# Patient Record
Sex: Female | Born: 1950 | Race: White | Hispanic: No | Smoking: Never smoker
Health system: Southern US, Community
[De-identification: ages and names within clinical notes are randomized; demographics above are authoritative.]

## PROBLEM LIST (undated history)

## (undated) DIAGNOSIS — K449 Diaphragmatic hernia without obstruction or gangrene: Secondary | ICD-10-CM

## (undated) DIAGNOSIS — G4730 Sleep apnea, unspecified: Secondary | ICD-10-CM

## (undated) DIAGNOSIS — I10 Essential (primary) hypertension: Secondary | ICD-10-CM

## (undated) DIAGNOSIS — N289 Disorder of kidney and ureter, unspecified: Secondary | ICD-10-CM

## (undated) HISTORY — PX: GASTRIC BYPASS: SHX52

## (undated) HISTORY — PX: APPENDECTOMY: SHX54

## (undated) HISTORY — PX: HERNIA REPAIR: SHX51

## (undated) HISTORY — PX: OTHER SURGICAL HISTORY: SHX169

## (undated) HISTORY — PX: ABDOMINAL HYSTERECTOMY: SHX81

## (undated) HISTORY — PX: BREAST SURGERY: SHX581

## (undated) HISTORY — PX: TONSILLECTOMY: SUR1361

## (undated) HISTORY — PX: CHOLECYSTECTOMY: SHX55

---

## 2005-01-15 ENCOUNTER — Emergency Department: Payer: Self-pay | Admitting: Emergency Medicine

## 2006-03-18 ENCOUNTER — Emergency Department: Payer: Self-pay | Admitting: Unknown Physician Specialty

## 2006-04-01 ENCOUNTER — Emergency Department: Payer: Self-pay | Admitting: Unknown Physician Specialty

## 2006-04-01 ENCOUNTER — Emergency Department: Payer: Self-pay | Admitting: Emergency Medicine

## 2008-02-04 ENCOUNTER — Emergency Department (HOSPITAL_COMMUNITY): Admission: EM | Admit: 2008-02-04 | Discharge: 2008-02-04 | Payer: Self-pay | Admitting: Emergency Medicine

## 2008-10-01 ENCOUNTER — Ambulatory Visit: Payer: Self-pay | Admitting: Internal Medicine

## 2009-03-15 ENCOUNTER — Ambulatory Visit: Payer: Self-pay | Admitting: Family

## 2009-03-23 ENCOUNTER — Ambulatory Visit: Payer: Self-pay | Admitting: Urology

## 2009-03-25 ENCOUNTER — Ambulatory Visit: Payer: Self-pay | Admitting: Urology

## 2009-04-08 ENCOUNTER — Ambulatory Visit: Payer: Self-pay | Admitting: Urology

## 2009-07-19 ENCOUNTER — Ambulatory Visit: Payer: Self-pay | Admitting: Internal Medicine

## 2009-10-11 ENCOUNTER — Emergency Department: Payer: Self-pay | Admitting: Unknown Physician Specialty

## 2009-11-23 ENCOUNTER — Other Ambulatory Visit: Payer: Self-pay | Admitting: Internal Medicine

## 2009-12-17 ENCOUNTER — Ambulatory Visit: Payer: Self-pay | Admitting: Internal Medicine

## 2010-01-18 ENCOUNTER — Emergency Department: Payer: Self-pay | Admitting: Emergency Medicine

## 2010-06-30 ENCOUNTER — Other Ambulatory Visit: Payer: Self-pay | Admitting: Internal Medicine

## 2010-11-18 ENCOUNTER — Other Ambulatory Visit: Payer: Self-pay | Admitting: Internal Medicine

## 2010-12-07 ENCOUNTER — Ambulatory Visit: Payer: Self-pay | Admitting: Internal Medicine

## 2011-02-12 IMAGING — CR DG ABDOMEN 1V
1 series · 2 of 2 positions shown · non-contrast
Comparison: none

REASON FOR EXAM: kidney stone   send film with pt
COMMENTS:

[Series 1: view not recorded · 0.17mm/px · 2 of 2 slices shown]
[im 1/2]
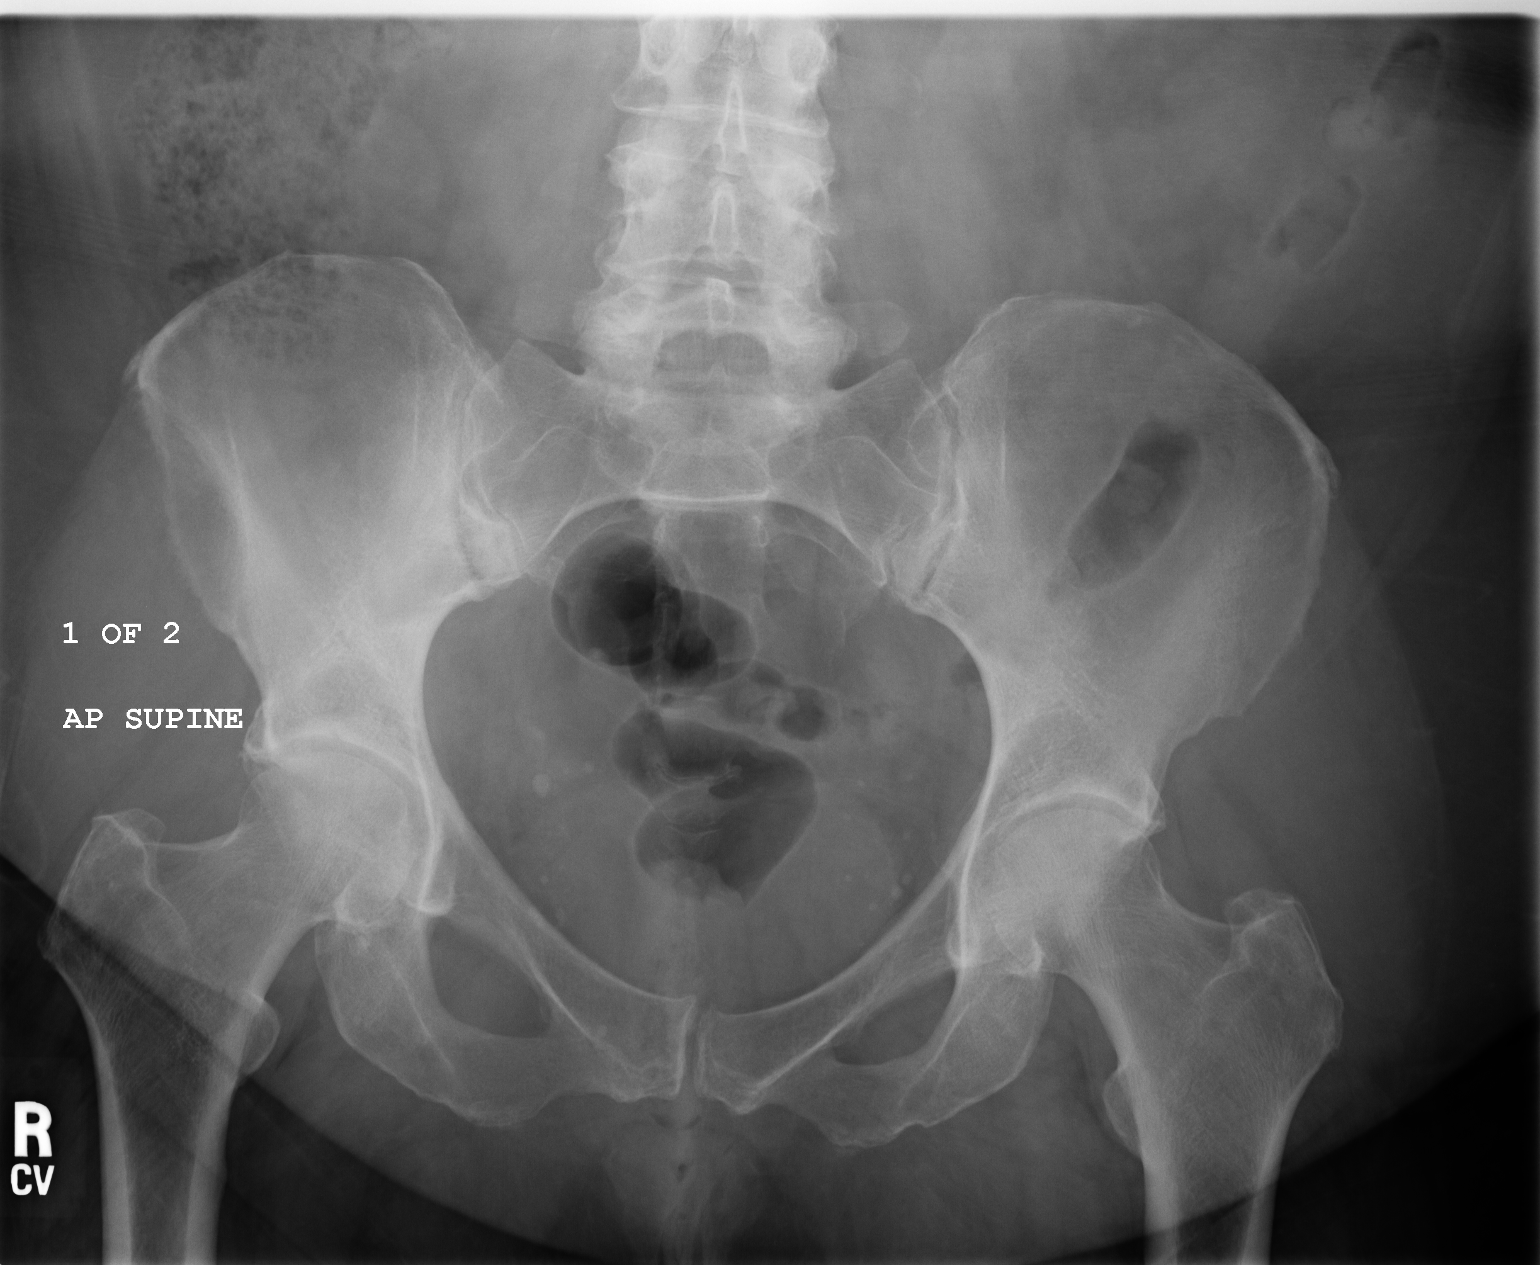
[im 2/2]
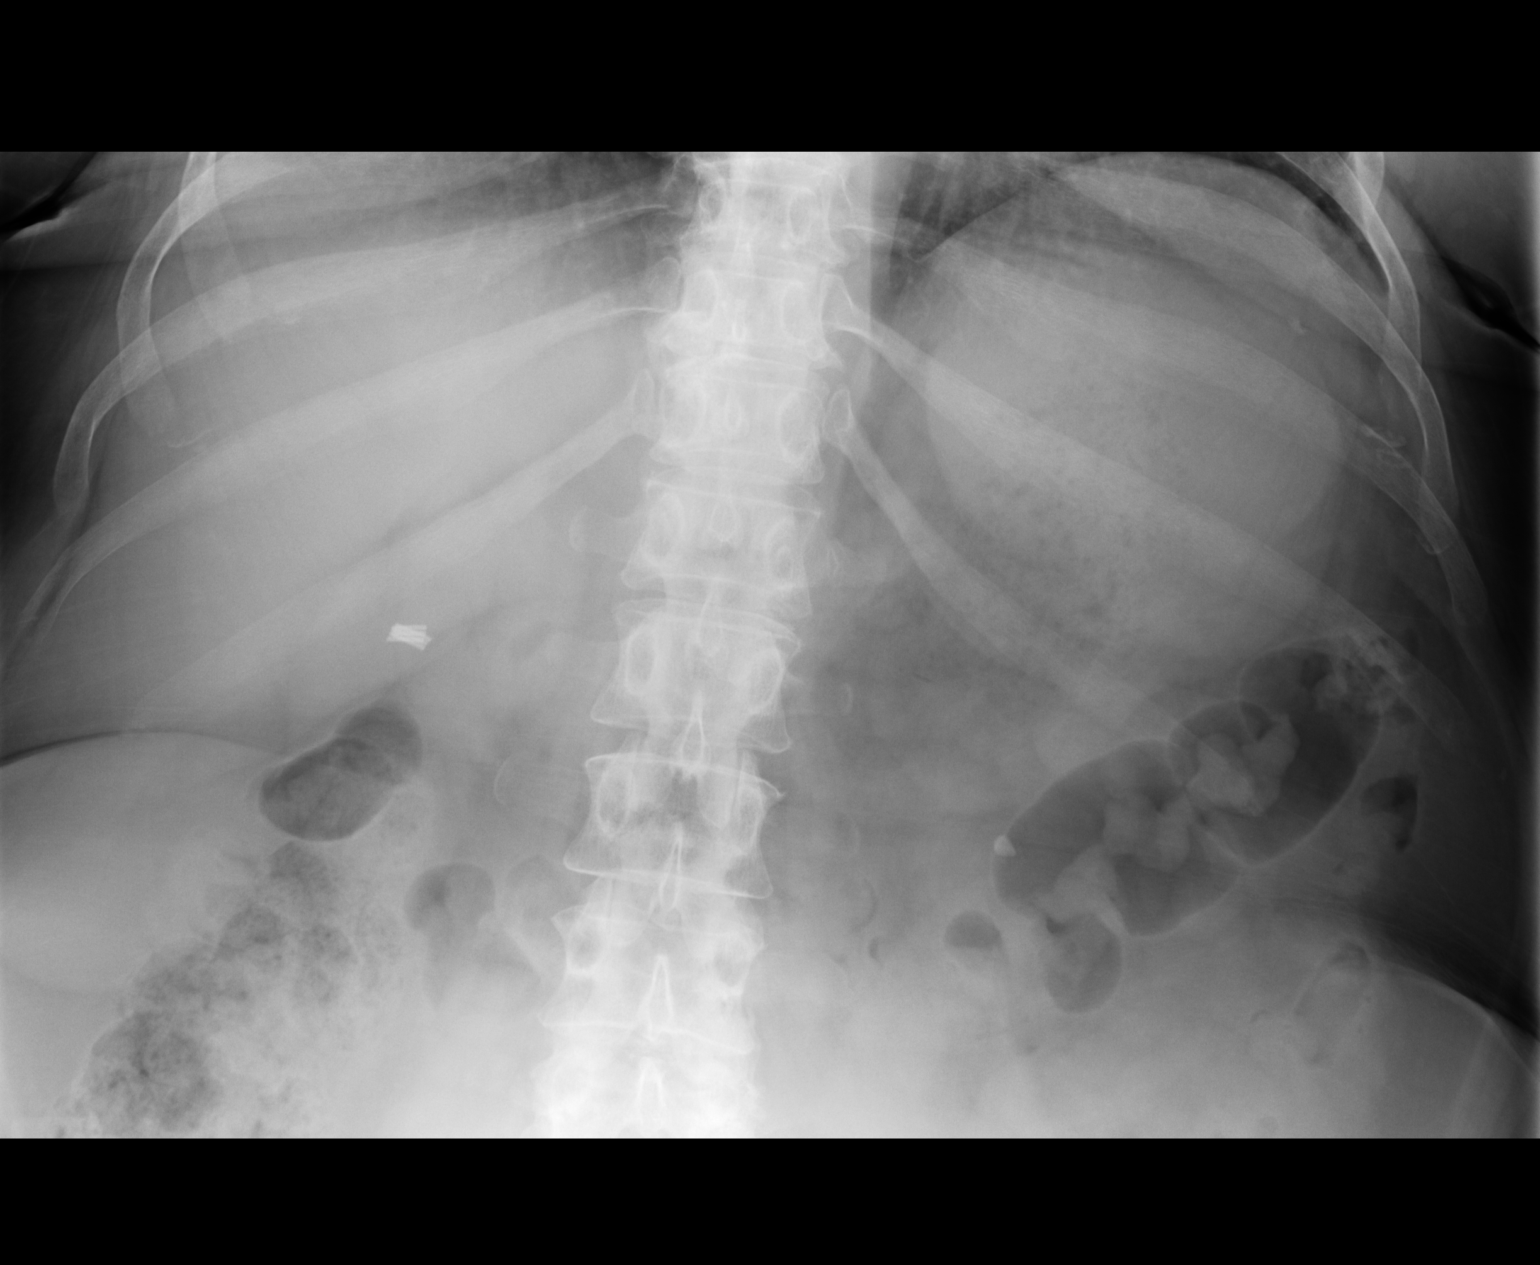

[2 of 2 positions shown; findings below may reference images not displayed]

PROCEDURE:     MDR - MDR KIDNEY URETER BLADDER  - March 23, 2009  [DATE]

RESULT:     The bowel gas pattern is within the limits of normal. There are
numerous phleboliths within the pelvis. There is a coarse calcification on
the left measuring 6 mm in diameter which likely reflects a stone. This may
be in the renal pelvis on the left but the renal outline is not well
demonstrated. I see no definite stone on the right. There are surgical clips
in the gallbladder fossa. There is mild narrowing of the left hip joint
space. Mild degenerative change of the lower lumbar spine is present.
IMPRESSION: There is a calcification that projects over the left renal
fossa which may reflect a 6 mm diameter stone.

## 2011-03-09 ENCOUNTER — Telehealth: Payer: Self-pay | Admitting: Internal Medicine

## 2011-03-09 NOTE — Telephone Encounter (Signed)
Out of rx  [place],labetalol  Wants to see if we have any samples of these.  If not call in 20 of the labeltalol she going to clinic to [facility]

## 2011-03-10 NOTE — Telephone Encounter (Signed)
Spoke with patient. She said that she was fired from her job so she has no insurance right now. She went to the [facility] and was placed on a sliding scale and will be getting her meds through them from now on unless she can get some insurance. She no longer needs refills from [place].

## 2011-03-10 NOTE — Telephone Encounter (Signed)
Left message with patient's daughter asking that patient call me back.

## 2011-03-13 ENCOUNTER — Ambulatory Visit: Payer: Self-pay | Admitting: Internal Medicine

## 2011-03-13 ENCOUNTER — Other Ambulatory Visit: Payer: Self-pay | Admitting: *Deleted

## 2011-03-13 DIAGNOSIS — Z0289 Encounter for other administrative examinations: Secondary | ICD-10-CM

## 2011-03-13 MED ORDER — LABETALOL HCL 200 MG PO TABS: 200.0000 mg | ORAL_TABLET | Freq: Two times a day (BID) | ORAL | Status: AC

## 2011-03-13 NOTE — Telephone Encounter (Signed)
Received faxed refill request from pharmacy. Is it okay to refill Labetalol?

## 2011-07-09 ENCOUNTER — Emergency Department (HOSPITAL_COMMUNITY): Payer: Self-pay

## 2011-07-09 ENCOUNTER — Inpatient Hospital Stay (HOSPITAL_COMMUNITY)
Admission: EM | Admit: 2011-07-09 | Discharge: 2011-07-10 | DRG: 392 | Disposition: A | Discharge status: Home / Self Care | Payer: Self-pay | Attending: Internal Medicine | Admitting: Internal Medicine

## 2011-07-09 DIAGNOSIS — E669 Obesity, unspecified: Secondary | ICD-10-CM | POA: Diagnosis present

## 2011-07-09 DIAGNOSIS — I10 Essential (primary) hypertension: Secondary | ICD-10-CM | POA: Diagnosis present

## 2011-07-09 DIAGNOSIS — K5732 Diverticulitis of large intestine without perforation or abscess without bleeding: Principal | ICD-10-CM | POA: Diagnosis present

## 2011-07-09 DIAGNOSIS — K5792 Diverticulitis of intestine, part unspecified, without perforation or abscess without bleeding: Secondary | ICD-10-CM

## 2011-07-09 DIAGNOSIS — E119 Type 2 diabetes mellitus without complications: Secondary | ICD-10-CM | POA: Diagnosis present

## 2011-07-09 HISTORY — DX: Essential (primary) hypertension: I10

## 2011-07-09 HISTORY — DX: Sleep apnea, unspecified: G47.30

## 2011-07-09 HISTORY — DX: Diaphragmatic hernia without obstruction or gangrene: K44.9

## 2011-07-09 LAB — DIFFERENTIAL
Basophils Relative: 0 % (ref 0–1)
Eosinophils Absolute: 0.2 10*3/uL (ref 0.0–0.7)
Lymphs Abs: 1.8 10*3/uL (ref 0.7–4.0)
Monocytes Absolute: 0.5 10*3/uL (ref 0.1–1.0)
Monocytes Relative: 4 % (ref 3–12)

## 2011-07-09 LAB — COMPREHENSIVE METABOLIC PANEL
Albumin: 3.8 g/dL (ref 3.5–5.2)
Alkaline Phosphatase: 62 U/L (ref 39–117)
BUN: 17 mg/dL (ref 6–23)
Creatinine, Ser: 1.02 mg/dL (ref 0.50–1.10)
GFR calc Af Amer: 69 mL/min — ABNORMAL LOW (ref >90)
Glucose, Bld: 250 mg/dL — ABNORMAL HIGH (ref 70–99)
Potassium: 3.9 mEq/L (ref 3.5–5.1)
Total Bilirubin: 0.3 mg/dL (ref 0.3–1.2)
Total Protein: 7.8 g/dL (ref 6.0–8.3)

## 2011-07-09 LAB — CBC
HCT: 35.3 % — ABNORMAL LOW (ref 36.0–46.0)
Hemoglobin: 11.5 g/dL — ABNORMAL LOW (ref 12.0–15.0)
MCH: 28 pg (ref 26.0–34.0)
MCHC: 32.6 g/dL (ref 30.0–36.0)
MCV: 86.1 fL (ref 78.0–100.0)
RBC: 4.1 MIL/uL (ref 3.87–5.11)

## 2011-07-09 LAB — URINALYSIS, ROUTINE W REFLEX MICROSCOPIC
Bilirubin Urine: NEGATIVE
Glucose, UA: 500 mg/dL — AB
Hgb urine dipstick: NEGATIVE
Ketones, ur: NEGATIVE mg/dL
Specific Gravity, Urine: >1.03 — ABNORMAL HIGH (ref 1.005–1.030)
pH: 5.5 (ref 5.0–8.0)

## 2011-07-09 MED ORDER — FENOFIBRATE 160 MG PO TABS
160.0000 mg | ORAL_TABLET | Freq: Every day | ORAL | Status: DC
  Administered 2011-07-09 – 2011-07-10 (×2): 160 mg via ORAL
  Filled 2011-07-09 (×4): qty 1

## 2011-07-09 MED ORDER — SODIUM CHLORIDE 0.45 % IV SOLN: INTRAVENOUS | Status: DC

## 2011-07-09 MED ORDER — AMLODIPINE BESYLATE 5 MG PO TABS
10.0000 mg | ORAL_TABLET | Freq: Every day | ORAL | Status: DC
  Administered 2011-07-09 – 2011-07-10 (×2): 10 mg via ORAL
  Filled 2011-07-09: qty 1
  Filled 2011-07-09: qty 2
  Filled 2011-07-09: qty 1

## 2011-07-09 MED ORDER — CIPROFLOXACIN IN D5W 400 MG/200ML IV SOLN
400.0000 mg | Freq: Two times a day (BID) | INTRAVENOUS | Status: DC
  Administered 2011-07-09 – 2011-07-10 (×2): 400 mg via INTRAVENOUS
  Filled 2011-07-09 (×4): qty 200

## 2011-07-09 MED ORDER — ACETAMINOPHEN 650 MG RE SUPP: 650.0000 mg | Freq: Four times a day (QID) | RECTAL | Status: DC | PRN

## 2011-07-09 MED ORDER — ACETAMINOPHEN 325 MG PO TABS: 650.0000 mg | ORAL_TABLET | Freq: Four times a day (QID) | ORAL | Status: DC | PRN

## 2011-07-09 MED ORDER — HYDROCODONE-ACETAMINOPHEN 5-325 MG PO TABS: 1.0000 | ORAL_TABLET | ORAL | Status: DC | PRN

## 2011-07-09 MED ORDER — INSULIN ASPART 100 UNIT/ML ~~LOC~~ SOLN
0.0000 [IU] | Freq: Every day | SUBCUTANEOUS | Status: DC
  Administered 2011-07-09: 2 [IU] via SUBCUTANEOUS

## 2011-07-09 MED ORDER — METRONIDAZOLE IN NACL 5-0.79 MG/ML-% IV SOLN
500.0000 mg | Freq: Three times a day (TID) | INTRAVENOUS | Status: DC
  Administered 2011-07-09 – 2011-07-10 (×2): 500 mg via INTRAVENOUS
  Filled 2011-07-09 (×6): qty 100

## 2011-07-09 MED ORDER — FENOFIBRATE 145 MG PO TABS
ORAL_TABLET | ORAL | Status: AC
Start: 2011-07-09 — End: 2011-07-09
  Filled 2011-07-09: qty 1

## 2011-07-09 MED ORDER — LIRAGLUTIDE 18 MG/3ML ~~LOC~~ SOLN
0.2000 mL | Freq: Every day | SUBCUTANEOUS | Status: DC
  Filled 2011-07-09 (×4): qty 0.2

## 2011-07-09 MED ORDER — CIPROFLOXACIN IN D5W 400 MG/200ML IV SOLN
INTRAVENOUS | Status: AC
  Filled 2011-07-09: qty 200

## 2011-07-09 MED ORDER — LOSARTAN POTASSIUM 50 MG PO TABS
25.0000 mg | ORAL_TABLET | Freq: Every day | ORAL | Status: DC
  Administered 2011-07-09: 25 mg via ORAL
  Filled 2011-07-09 (×2): qty 1

## 2011-07-09 MED ORDER — INSULIN DETEMIR 100 UNIT/ML ~~LOC~~ SOLN
80.0000 [IU] | Freq: Every day | SUBCUTANEOUS | Status: DC
  Administered 2011-07-09: 80 [IU] via SUBCUTANEOUS
  Filled 2011-07-09: qty 3

## 2011-07-09 MED ORDER — ONDANSETRON HCL 4 MG/2ML IJ SOLN: 4.0000 mg | Freq: Four times a day (QID) | INTRAMUSCULAR | Status: DC | PRN

## 2011-07-09 MED ORDER — INSULIN ASPART 100 UNIT/ML ~~LOC~~ SOLN
0.0000 [IU] | Freq: Three times a day (TID) | SUBCUTANEOUS | Status: DC
  Administered 2011-07-10: 3 [IU] via SUBCUTANEOUS
  Filled 2011-07-09: qty 3

## 2011-07-09 MED ORDER — METRONIDAZOLE IN NACL 5-0.79 MG/ML-% IV SOLN
INTRAVENOUS | Status: AC
  Filled 2011-07-09: qty 100

## 2011-07-09 MED ORDER — SODIUM CHLORIDE 0.9 % IV SOLN
INTRAVENOUS | Status: DC
  Administered 2011-07-09: 1000 mL via INTRAVENOUS

## 2011-07-09 MED ORDER — LABETALOL HCL 200 MG PO TABS
200.0000 mg | ORAL_TABLET | Freq: Two times a day (BID) | ORAL | Status: DC
  Administered 2011-07-09 – 2011-07-10 (×2): 200 mg via ORAL
  Filled 2011-07-09 (×2): qty 1

## 2011-07-09 MED ORDER — ONDANSETRON HCL 4 MG PO TABS: 4.0000 mg | ORAL_TABLET | Freq: Four times a day (QID) | ORAL | Status: DC | PRN

## 2011-07-09 MED ORDER — ENOXAPARIN SODIUM 40 MG/0.4ML ~~LOC~~ SOLN
40.0000 mg | SUBCUTANEOUS | Status: DC
  Administered 2011-07-09: 40 mg via SUBCUTANEOUS
  Filled 2011-07-09: qty 0.4

## 2011-07-09 NOTE — ED Notes (Signed)
Pt c/o pain in her lower abdomen radiating around to her back since last night. States that she always has urinary frequency and nausea due to her diabetes but nothing is abnormal. Pt sitting on stretcher on cell phone. Alert and oriented x 3. Skin warm and dry. Color pink.

## 2011-07-09 NOTE — ED Notes (Signed)
Family at bedside. 

## 2011-07-09 NOTE — ED Notes (Signed)
Pt being admitted. Awaiting bed placement.

## 2011-07-09 NOTE — ED Provider Notes (Signed)
History  This chart was scribed for Allison Lennert, MD by Bennett Scrape. This patient was seen in room APA08/APA08 and the patient's care was started at 1:23PM.  CSN: [id number]  Arrival date & time 07/09/11  1153   First MD Initiated Contact with Patient 07/09/11 1318      Chief Complaint  Patient presents with  . Abdominal Pain  . Pelvic Pain  . Nausea    Patient is a 61 y.o. female presenting with abdominal pain. The history is provided by the patient. No language interpreter was used.  Abdominal Pain The primary symptoms of the illness include abdominal pain and nausea. The primary symptoms of the illness do not include fever, fatigue, vomiting, diarrhea or dysuria. The current episode started yesterday. The onset of the illness was sudden. The problem has been gradually worsening.  The abdominal pain began yesterday. The pain came on suddenly. The abdominal pain has been gradually worsening since its onset. The abdominal pain is located in the LLQ and suprapubic region. The abdominal pain does not radiate. The severity of the abdominal pain is 7/10. The abdominal pain is relieved by certain positions. The abdominal pain is exacerbated by certain positions.  Additional symptoms associated with the illness include chills. Symptoms associated with the illness do not include anorexia, diaphoresis, constipation, urgency, hematuria, frequency or back pain. Significant associated medical issues include diabetes. Significant associated medical issues do not include inflammatory bowel disease, liver disease or diverticulitis.    Allison Lara is a 61 y.o. female who presents to the Emergency Department complaining of 24 hours of LLQ abdominal pain described as a stabbing sensation with associated nausea and chills. Symptoms are aggravated by sitting up and improved with laying down. Pt reports taking an IB profen this morning with mild improvement in symptoms. Pt states that she has had  similar episodes experienced with kidney stones but that the pain is located in a different area of the abdomen. Pt also states that she has had constant vomiting for a few months. She states that she vomits up everything she eats. She has been to see her PCP on the matter with no resolvement of the issue. She has a h/o HTN, diabetes and hiatal hernia. Pt denies smoking and drinking.  Past Medical History  Diagnosis Date  . Diabetes mellitus   . Hypertension   . Sleep apnea   . Hiatal hernia     Past Surgical History  Procedure Date  . Appendectomy   . Abdominal hysterectomy   . Cholecystectomy   . Breast surgery   . Tubal ligation   . Tonsillectomy     No family history on file.  History  Substance Use Topics  . Smoking status: Never Smoker   . Smokeless tobacco: Not on file  . Alcohol Use: No    OB History    Grav Para Term Preterm Abortions TAB SAB Ect Mult Living                  Review of Systems  Constitutional: Positive for chills. Negative for fever, diaphoresis and fatigue.  HENT: Negative for congestion, sinus pressure and ear discharge.   Eyes: Negative for discharge.  Respiratory: Negative for cough.   Cardiovascular: Negative for chest pain.  Gastrointestinal: Positive for nausea and abdominal pain. Negative for vomiting, diarrhea, constipation and anorexia.  Genitourinary: Negative for dysuria, urgency, frequency and hematuria.  Musculoskeletal: Negative for back pain.  Skin: Negative for rash.  Neurological: Negative for  seizures and headaches.  Hematological: Negative.   Psychiatric/Behavioral: Negative for hallucinations.    Allergies  Codeine; Morphine and related; and Toradol  Home Medications   Current Outpatient Rx  Name Route Sig Dispense Refill  . AMLODIPINE BESYLATE 10 MG PO TABS Oral Take 10 mg by mouth daily.      . FENOFIBRATE 145 MG PO TABS Oral Take 145 mg by mouth daily.      . IBUPROFEN 200 MG PO TABS Oral Take 200 mg by mouth  every 6 (six) hours as needed. For pain     . INSULIN DETEMIR 100 UNIT/ML [place] SOLN Subcutaneous Inject 120 Units into the skin at bedtime.      Marland Kitchen LABETALOL HCL 200 MG PO TABS Oral Take 1 tablet (200 mg total) by mouth 2 (two) times daily. 60 tablet 6  . LIRAGLUTIDE 18 MG/3ML [place] SOLN Subcutaneous Inject 0.2 mLs into the skin daily.      Marland Kitchen LOSARTAN POTASSIUM 25 MG PO TABS Oral Take 25 mg by mouth daily.        Triage Vitals: BP 142/78  Pulse 81  Temp(Src) 98.6 F (37 C) (Oral)  Resp 18  Ht 5\' 4"  (1.626 m)  Wt 222 lb (100.699 kg)  BMI 38.11 kg/m2  SpO2 98%  Physical Exam  Nursing note and vitals reviewed. Constitutional: She is oriented to person, place, and time. She appears well-developed and well-nourished.  HENT:  Head: Normocephalic and atraumatic.  Eyes: Conjunctivae and EOM are normal. No scleral icterus.  Neck: Neck supple. No thyromegaly present.  Cardiovascular: Normal rate and regular rhythm.  Exam reveals no gallop and no friction rub.   No murmur heard. Pulmonary/Chest: Effort normal and breath sounds normal. No stridor. She has no wheezes. She has no rales. She exhibits no tenderness.  Abdominal: She exhibits no distension. There is tenderness (Mild to moderate LLQ tenderness). There is no rebound.  Musculoskeletal: Normal range of motion. She exhibits no edema.  Lymphadenopathy:    She has no cervical adenopathy.  Neurological: She is alert and oriented to person, place, and time. Coordination normal.  Skin: Skin is warm and dry. No rash noted. No erythema.  Psychiatric: She has a normal mood and affect. Her behavior is normal.    ED Course  Procedures (including critical care time)  DIAGNOSTIC STUDIES: Oxygen Saturation is 98% on room air, normal by my interpretation.    COORDINATION OF CARE: 1:27PM-Discussed treatment plan with pt at bedside and pt agreed to plan. 3:12PM-All labs and x-rays were reviewed with the patient and the pt decided to be admitted for  further treatment.  Labs Reviewed  URINALYSIS, ROUTINE W REFLEX MICROSCOPIC - Abnormal; Notable for the following:    APPearance HAZY (*)    Specific Gravity, Urine >1.030 (*)    Glucose, UA 500 (*)    All other components within normal limits  CBC - Abnormal; Notable for the following:    WBC 12.0 (*)    Hemoglobin 11.5 (*)    HCT 35.3 (*)    All other components within normal limits  DIFFERENTIAL - Abnormal; Notable for the following:    Neutrophils Relative 80 (*)    Neutro Abs 9.6 (*)    All other components within normal limits  COMPREHENSIVE METABOLIC PANEL - Abnormal; Notable for the following:    Sodium 131 (*)    Glucose, Bld 250 (*)    GFR calc non Af Amer 59 (*)    GFR calc Af  Amer 42 (*)    All other components within normal limits   Ct Abdomen Pelvis Wo Contrast  07/09/2011  *RADIOLOGY REPORT*  Clinical Data: Left pelvic pain, status post cholecystectomy, appendectomy, and hysterectomy  CT ABDOMEN AND PELVIS WITHOUT CONTRAST  Technique:  Multidetector CT imaging of the abdomen and pelvis was performed following the standard protocol without intravenous contrast.  Comparison: None.  Findings: Lung bases are clear.  Hepatic steatosis.  Unenhanced spleen, pancreas, and adrenal glands are within normal limits.  Status post cholecystectomy.  No intrahepatic or extrahepatic ductal dilatation.  Kidneys are unremarkable.  No renal calculi or hydronephrosis.  No evidence of bowel obstruction.  Colonic diverticulosis, with inflammatory changes involving a loop of sigmoid colon compatible with diverticulitis (series 2/image 67).  No drainable fluid collection or abscess.  No free air.  Atherosclerotic calcifications of the abdominal aorta and branch vessels.  No abdominopelvic ascites.  No suspicious abdominopelvic lymphadenopathy.  Status post hysterectomy.  Ovaries are unremarkable.  Bladder is within normal limits.  Fat-containing umbilical hernia (series 2/image 52).  Mild degenerative  changes of the visualized thoracolumbar spine.  IMPRESSION: Sigmoid diverticulitis.  No drainable fluid collection or abscess. No free air.  Hepatic steatosis.  Original Report Authenticated By: Charline Bills, M.D.     1. Diverticulitis       MDM     The chart was scribed for me under my direct supervision.  I personally performed the history, physical, and medical decision making and all procedures in the evaluation of this patient.Allison Lennert, MD 07/09/11 1534

## 2011-07-09 NOTE — H&P (Signed)
PCP:   Shelia Media, MD, MD   Chief Complaint:  Abdominal pain  HPI: This is a 61 year old female with past medical history of insulin-dependent diabetes, hypertension. Patient reports the onset of acute left sided abdominal pain which began yesterday afternoon. She reports that its worse when she is laying on her left side. She has associated vomiting. She denies any diarrhea. She says that she had not noticed fever, but did feel some chills. She is unable to tolerate anything by mouth. She presents to the emergency room where she was evaluated with a CT scan of the abdomen. She was found to have acute diverticulitis of the sigmoid colon. Since she is unable to tolerate any medicines by mouth she's been referred for inpatient admission and IV antibiotics.  Allergies:   Allergies  Allergen Reactions  . Codeine Nausea And Vomiting  . Morphine And Related Other (See Comments)    Tachycardia  . Toradol Other (See Comments)    Burns my stomach      Past Medical History  Diagnosis Date  . Diabetes mellitus   . Hypertension   . Sleep apnea   . Hiatal hernia     Past Surgical History  Procedure Date  . Appendectomy   . Abdominal hysterectomy   . Cholecystectomy   . Breast surgery   . Tubal ligation   . Tonsillectomy     Prior to Admission medications   Medication Sig Start Date End Date Taking? Authorizing Provider  amLODipine (NORVASC) 10 MG tablet Take 10 mg by mouth daily.     Yes Historical Provider, MD  fenofibrate (TRICOR) 145 MG tablet Take 145 mg by mouth daily.     Yes Historical Provider, MD  ibuprofen (ADVIL,MOTRIN) 200 MG tablet Take 200 mg by mouth every 6 (six) hours as needed. For pain    Yes Historical Provider, MD  insulin detemir (LEVEMIR) 100 UNIT/ML injection Inject 120 Units into the skin at bedtime.     Yes Historical Provider, MD  labetalol (NORMODYNE) 200 MG tablet Take 1 tablet (200 mg total) by mouth 2 (two) times daily. 03/13/11  Yes Shelia Media, MD  Liraglutide (VICTOZA) 18 MG/3ML SOLN Inject 0.2 mLs into the skin daily.     Yes Historical Provider, MD  losartan (COZAAR) 25 MG tablet Take 25 mg by mouth daily.     Yes Historical Provider, MD    Social History:  reports that she has never smoked. She does not have any smokeless tobacco history on file. She reports that she does not drink alcohol or use illicit drugs.  History reviewed. No pertinent family history.  Review of Systems: Positives are in bold Constitutional: Denies fever, chills, diaphoresis, appetite change and fatigue.  HEENT: Denies photophobia, eye pain, redness, hearing loss, ear pain, congestion, sore throat, rhinorrhea, sneezing, mouth sores, trouble swallowing, neck pain, neck stiffness and tinnitus.   Respiratory: Denies SOB, DOE, cough, chest tightness,  and wheezing.   Cardiovascular: Denies chest pain, palpitations and leg swelling.  Gastrointestinal: Denies nausea, vomiting, abdominal pain, diarrhea, constipation, blood in stool and abdominal distention.  Genitourinary: Denies dysuria, urgency, frequency, hematuria, flank pain and difficulty urinating.  Musculoskeletal: Denies myalgias, back pain, joint swelling, arthralgias and gait problem.  Skin: Denies pallor, rash and wound.  Neurological: Denies dizziness, seizures, syncope, weakness, light-headedness, numbness and headaches.  Hematological: Denies adenopathy. Easy bruising, personal or family bleeding history  Psychiatric/Behavioral: Denies suicidal ideation, mood changes, confusion, nervousness, sleep disturbance and agitation   Physical  Exam: Blood pressure 151/87, pulse 80, temperature 98.1 F (36.7 C), temperature source Oral, resp. rate 20, height 5\' 4"  (1.626 m), weight 101.152 kg (223 lb), SpO2 94.00%. General: Patient is lying in bed, in no acute distress, alert and oriented x3 HEENT: Normocephalic, atraumatic, pupils are equal round reactive to light Chest: Clear to auscultation  bilaterally Cardiac: S1, S2, regular rate Abdomen: Soft, tender in the left lower corner, no rigidity, bowel sounds are active Extremities: No cyanosis, edema, or clubbing Neurologic: Grossly intact, nonfocal Skin: Warm, intact  Labs on Admission:  Results for orders placed during the hospital encounter of 07/09/11 (from the past 48 hour(s))  URINALYSIS, ROUTINE W REFLEX MICROSCOPIC     Status: Abnormal   Collection Time   07/09/11 12:32 PM      Component Value Range Comment   Color, Urine YELLOW  YELLOW     APPearance HAZY (*) CLEAR     Specific Gravity, Urine >1.030 (*) 1.005 - 1.030     pH 5.5  5.0 - 8.0     Glucose, UA 500 (*) NEGATIVE (mg/dL)    Hgb urine dipstick NEGATIVE  NEGATIVE     Bilirubin Urine NEGATIVE  NEGATIVE     Ketones, ur NEGATIVE  NEGATIVE (mg/dL)    Protein, ur NEGATIVE  NEGATIVE (mg/dL)    Urobilinogen, UA 0.2  0.0 - 1.0 (mg/dL)    Nitrite NEGATIVE  NEGATIVE     Leukocytes, UA NEGATIVE  NEGATIVE  MICROSCOPIC NOT DONE ON URINES WITH NEGATIVE PROTEIN, BLOOD, LEUKOCYTES, NITRITE, OR GLUCOSE <1000 mg/dL.  CBC     Status: Abnormal   Collection Time   07/09/11  1:42 PM      Component Value Range Comment   WBC 12.0 (*) 4.0 - 10.5 (K/uL)    RBC 4.10  3.87 - 5.11 (MIL/uL)    Hemoglobin 11.5 (*) 12.0 - 15.0 (g/dL)    HCT 16.1 (*) 09.6 - 46.0 (%)    MCV 86.1  78.0 - 100.0 (fL)    MCH 28.0  26.0 - 34.0 (pg)    MCHC 32.6  30.0 - 36.0 (g/dL)    RDW 04.5  40.9 - 81.1 (%)    Platelets 360  150 - 400 (K/uL)   DIFFERENTIAL     Status: Abnormal   Collection Time   07/09/11  1:42 PM      Component Value Range Comment   Neutrophils Relative 80 (*) 43 - 77 (%)    Neutro Abs 9.6 (*) 1.7 - 7.7 (K/uL)    Lymphocytes Relative 15  12 - 46 (%)    Lymphs Abs 1.8  0.7 - 4.0 (K/uL)    Monocytes Relative 4  3 - 12 (%)    Monocytes Absolute 0.5  0.1 - 1.0 (K/uL)    Eosinophils Relative 1  0 - 5 (%)    Eosinophils Absolute 0.2  0.0 - 0.7 (K/uL)    Basophils Relative 0  0 - 1 (%)     Basophils Absolute 0.0  0.0 - 0.1 (K/uL)   COMPREHENSIVE METABOLIC PANEL     Status: Abnormal   Collection Time   07/09/11  1:42 PM      Component Value Range Comment   Sodium 131 (*) 135 - 145 (mEq/L)    Potassium 3.9  3.5 - 5.1 (mEq/L)    Chloride 97  96 - 112 (mEq/L)    CO2 26  19 - 32 (mEq/L)    Glucose, Bld 250 (*) 70 -  99 (mg/dL)    BUN 17  6 - 23 (mg/dL)    Creatinine, Ser 1.61  0.50 - 1.10 (mg/dL)    Calcium 09.6  8.4 - 10.5 (mg/dL)    Total Protein 7.8  6.0 - 8.3 (g/dL)    Albumin 3.8  3.5 - 5.2 (g/dL)    AST 11  0 - 37 (U/L)    ALT 16  0 - 35 (U/L)    Alkaline Phosphatase 62  39 - 117 (U/L)    Total Bilirubin 0.3  0.3 - 1.2 (mg/dL)    GFR calc non Af Amer 59 (*) >90 (mL/min)    GFR calc Af Amer 69 (*) >90 (mL/min)   GLUCOSE, CAPILLARY     Status: Abnormal   Collection Time   07/09/11  5:42 PM      Component Value Range Comment   Glucose-Capillary 146 (*) 70 - 99 (mg/dL)    Comment 1 Documented in Chart      Comment 2 Notify RN       Radiological Exams on Admission: Ct Abdomen Pelvis Wo Contrast  07/09/2011  *RADIOLOGY REPORT*  Clinical Data: Left pelvic pain, status post cholecystectomy, appendectomy, and hysterectomy  CT ABDOMEN AND PELVIS WITHOUT CONTRAST  Technique:  Multidetector CT imaging of the abdomen and pelvis was performed following the standard protocol without intravenous contrast.  Comparison: None.  Findings: Lung bases are clear.  Hepatic steatosis.  Unenhanced spleen, pancreas, and adrenal glands are within normal limits.  Status post cholecystectomy.  No intrahepatic or extrahepatic ductal dilatation.  Kidneys are unremarkable.  No renal calculi or hydronephrosis.  No evidence of bowel obstruction.  Colonic diverticulosis, with inflammatory changes involving a loop of sigmoid colon compatible with diverticulitis (series 2/image 67).  No drainable fluid collection or abscess.  No free air.  Atherosclerotic calcifications of the abdominal aorta and branch vessels.   No abdominopelvic ascites.  No suspicious abdominopelvic lymphadenopathy.  Status post hysterectomy.  Ovaries are unremarkable.  Bladder is within normal limits.  Fat-containing umbilical hernia (series 2/image 52).  Mild degenerative changes of the visualized thoracolumbar spine.  IMPRESSION: Sigmoid diverticulitis.  No drainable fluid collection or abscess. No free air.  Hepatic steatosis.  Original Report Authenticated By: Charline Bills, M.D.    Assessment/Plan Principal Problem:  *Diverticulitis of sigmoid colon: Patient will be started on IV antibiotics with ciprofloxacin and Flagyl. She'll be monitored for any recurrence of fever. She reports that since she has been in the hospital, that her nausea/vomiting has somewhat improved. She is wanting to try a solid diet. We will place her on solid food and monitor how she does. When she is able to tolerate solid food without any significant vomiting we will transition her to oral antibiotics and plan on discharge home. This may be as early as tomorrow.  Active Problems:  DM (diabetes mellitus) patient takes very large amounts of levemir. We will decrease her dosing from 120 units at bedtime to 80 units at bedtime until she is eating more consistently. We will also place her sliding scale insulin.   HTN (hypertension) continue outpatient medication  Disposition: Anticipate discharge on the next 1-2 days  Time Spent on Admission:  Isley Weisheit [facility] 07/09/2011, 7:06 PM

## 2011-07-09 NOTE — ED Notes (Signed)
Pt presents with LLQ abdominal /pelvic pain and nausea. Pt denies v/d,vaginal bleeding, and vaginal discharge. Pt state symptoms started yesterday.

## 2011-07-10 LAB — CBC
HCT: 34.4 % — ABNORMAL LOW (ref 36.0–46.0)
MCHC: 32.8 g/dL (ref 30.0–36.0)
MCV: 86.6 fL (ref 78.0–100.0)
RDW: 13.6 % (ref 11.5–15.5)

## 2011-07-10 LAB — BASIC METABOLIC PANEL
BUN: 14 mg/dL (ref 6–23)
CO2: 25 mEq/L (ref 19–32)
Chloride: 100 mEq/L (ref 96–112)
Creatinine, Ser: 1.03 mg/dL (ref 0.50–1.10)
Glucose, Bld: 175 mg/dL — ABNORMAL HIGH (ref 70–99)

## 2011-07-10 LAB — GLUCOSE, CAPILLARY: Glucose-Capillary: 196 mg/dL — ABNORMAL HIGH (ref 70–99)

## 2011-07-10 MED ORDER — CIPROFLOXACIN IN D5W 400 MG/200ML IV SOLN
INTRAVENOUS | Status: AC
  Filled 2011-07-10: qty 200

## 2011-07-10 MED ORDER — METRONIDAZOLE 500 MG PO TABS: 500.0000 mg | ORAL_TABLET | Freq: Three times a day (TID) | ORAL | Status: AC

## 2011-07-10 MED ORDER — CIPROFLOXACIN HCL 500 MG PO TABS: 500.0000 mg | ORAL_TABLET | Freq: Two times a day (BID) | ORAL | Status: AC

## 2011-07-10 MED ORDER — HYDROCODONE-ACETAMINOPHEN 5-325 MG PO TABS: 1.0000 | ORAL_TABLET | ORAL | Status: AC | PRN

## 2011-07-10 NOTE — Discharge Summary (Signed)
Physician Discharge Summary  Patient ID: Allison Lara MRN: [id number] DOB/AGE: [birth year] 59 y.o. Primary Care Physician:WALKER,JENNIFER A, MD, MD Admit date: 07/09/2011 Discharge date: 07/10/2011    Discharge Diagnoses:  1. Sigmoid diverticulitis. 2. Diabetes mellitus type 2. 3. Hypertension 4. Obesity.   Current Discharge Medication List    START taking these medications   Details  ciprofloxacin (CIPRO) 500 MG tablet Take 1 tablet (500 mg total) by mouth 2 (two) times daily. Qty: 14 tablet, Refills: 0    HYDROcodone-acetaminophen (NORCO) 5-325 MG per tablet Take 1-2 tablets by mouth every 4 (four) hours as needed. Qty: 30 tablet, Refills: 0    metroNIDAZOLE (FLAGYL) 500 MG tablet Take 1 tablet (500 mg total) by mouth 3 (three) times daily. Qty: 21 tablet, Refills: 0      CONTINUE these medications which have NOT CHANGED   Details  amLODipine (NORVASC) 10 MG tablet Take 10 mg by mouth daily.      fenofibrate (TRICOR) 145 MG tablet Take 145 mg by mouth daily.      insulin detemir (LEVEMIR) 100 UNIT/ML injection Inject 120 Units into the skin at bedtime.      labetalol (NORMODYNE) 200 MG tablet Take 1 tablet (200 mg total) by mouth 2 (two) times daily. Qty: 60 tablet, Refills: 6    Liraglutide (VICTOZA) 18 MG/3ML SOLN Inject 0.2 mLs into the skin daily.      losartan (COZAAR) 25 MG tablet Take 25 mg by mouth daily.        STOP taking these medications     ibuprofen (ADVIL,MOTRIN) 200 MG tablet         Discharged Condition: Stable and improved.    Consults: None.  Significant Diagnostic Studies: Ct Abdomen Pelvis Wo Contrast  07/09/2011  *RADIOLOGY REPORT*  Clinical Data: Left pelvic pain, status post cholecystectomy, appendectomy, and hysterectomy  CT ABDOMEN AND PELVIS WITHOUT CONTRAST  Technique:  Multidetector CT imaging of the abdomen and pelvis was performed following the standard protocol without intravenous contrast.  Comparison: None.  Findings: Lung  bases are clear.  Hepatic steatosis.  Unenhanced spleen, pancreas, and adrenal glands are within normal limits.  Status post cholecystectomy.  No intrahepatic or extrahepatic ductal dilatation.  Kidneys are unremarkable.  No renal calculi or hydronephrosis.  No evidence of bowel obstruction.  Colonic diverticulosis, with inflammatory changes involving a loop of sigmoid colon compatible with diverticulitis (series 2/image 67).  No drainable fluid collection or abscess.  No free air.  Atherosclerotic calcifications of the abdominal aorta and branch vessels.  No abdominopelvic ascites.  No suspicious abdominopelvic lymphadenopathy.  Status post hysterectomy.  Ovaries are unremarkable.  Bladder is within normal limits.  Fat-containing umbilical hernia (series 2/image 52).  Mild degenerative changes of the visualized thoracolumbar spine.  IMPRESSION: Sigmoid diverticulitis.  No drainable fluid collection or abscess. No free air.  Hepatic steatosis.  Original Report Authenticated By: Charline Bills, M.D.    Lab Results: Basic Metabolic Panel:  Basename 07/10/11 0613 07/09/11 1342  NA 132* 131*  K 3.8 3.9  CL 100 97  CO2 25 26  GLUCOSE 175* 250*  BUN 14 17  CREATININE 1.03 1.02  CALCIUM 9.6 10.1  MG -- --  PHOS -- --   Liver Function Tests:  Basename 07/09/11 1342  AST 11  ALT 16  ALKPHOS 62  BILITOT 0.3  PROT 7.8  ALBUMIN 3.8     CBC:  Basename 07/10/11 0613 07/09/11 1342  WBC 9.6 12.0*  NEUTROABS -- 9.6*  HGB 11.3* 11.5*  HCT 34.4* 35.3*  MCV 86.6 86.1  PLT 329 360       Hospital Course: This very pleasant 61 year old lady, diabetic, presented with symptoms of abdominal pain, left-sided with some associated nausea. There was not any significant vomiting. There is no significant diarrhea. She did have some chills. Interestingly, she had eaten several almonds  prior to these symptoms. CT scan of her abdomen confirmed the presence of acute sigmoid diverticulitis. She has done  well overnight with intravenous antibiotics. She has tolerated the diet, has no nausea or vomiting, still has some mild left lower quadrant abdominal pain which is bearable.  Discharge Exam: Blood pressure 112/64, pulse 72, temperature 98.3 F (36.8 C), temperature source Oral, resp. rate 20, height 5\' 4"  (1.626 m), weight 101.152 kg (223 lb), SpO2 96.00%. She looks systemically well. She is not toxic or septic. Her abdomen is soft and mildly tender in the left lower quadrant. Bowel sounds are clearly heard. There are no masses. There is no hepatosplenomegaly. She does not have any evidence of  chronic liver disease. Heart sounds are present and normal. Lung fields are clear. She is alert and orientated.  Disposition: Home. She will receive a further one week course of oral ciprofloxacin and metronidazole. We also discussed at length strategies for losing weight, surrounding the concept of a low glycemic index diet.  Discharge Orders    Future Orders Please Complete By Expires   Diet - low sodium heart healthy      Increase activity slowly         Follow-up Information    Follow up with Shelia Media, MD .         SignedWilson Singer Pager [phone]  07/10/2011, 11:09 AM

## 2012-02-20 ENCOUNTER — Telehealth: Payer: Self-pay | Admitting: *Deleted

## 2012-02-20 NOTE — Telephone Encounter (Signed)
This pt has never been seen at this office.

## 2012-02-20 NOTE — Telephone Encounter (Signed)
Cordelia Pen called requesting a Rx for a cpap machine for this patient.  Order needs to include diagnosis code and oxygen flow.  Order can be faxed to [facility] at [phone].

## 2012-02-21 NOTE — Telephone Encounter (Signed)
Cordelia Pen from [facility] notified. She is going to call [facility] to get old records, and also let patient know that he will need to be seen in our new office before this can be done.

## 2013-12-01 ENCOUNTER — Ambulatory Visit: Payer: Self-pay | Admitting: Nephrology

## 2014-03-24 ENCOUNTER — Ambulatory Visit: Payer: Self-pay | Admitting: Specialist

## 2014-11-05 LAB — HEMOGLOBIN A1C: Hgb A1c MFr Bld: 7 % — AB (ref 4.0–6.0)

## 2015-03-06 ENCOUNTER — Emergency Department (HOSPITAL_COMMUNITY): Payer: Medicare Other

## 2015-03-06 ENCOUNTER — Encounter (HOSPITAL_COMMUNITY): Payer: Self-pay | Admitting: Emergency Medicine

## 2015-03-06 ENCOUNTER — Emergency Department (HOSPITAL_COMMUNITY)
Admission: EM | Admit: 2015-03-06 | Discharge: 2015-03-06 | Disposition: A | Discharge status: Home / Self Care | Payer: Medicare Other | Attending: Emergency Medicine | Admitting: Emergency Medicine

## 2015-03-06 DIAGNOSIS — Z8719 Personal history of other diseases of the digestive system: Secondary | ICD-10-CM | POA: Diagnosis not present

## 2015-03-06 DIAGNOSIS — Y9289 Other specified places as the place of occurrence of the external cause: Secondary | ICD-10-CM | POA: Insufficient documentation

## 2015-03-06 DIAGNOSIS — Y9389 Activity, other specified: Secondary | ICD-10-CM | POA: Diagnosis not present

## 2015-03-06 DIAGNOSIS — I10 Essential (primary) hypertension: Secondary | ICD-10-CM | POA: Insufficient documentation

## 2015-03-06 DIAGNOSIS — Z79899 Other long term (current) drug therapy: Secondary | ICD-10-CM | POA: Insufficient documentation

## 2015-03-06 DIAGNOSIS — M19072 Primary osteoarthritis, left ankle and foot: Secondary | ICD-10-CM | POA: Diagnosis not present

## 2015-03-06 DIAGNOSIS — Y998 Other external cause status: Secondary | ICD-10-CM | POA: Insufficient documentation

## 2015-03-06 DIAGNOSIS — S99922A Unspecified injury of left foot, initial encounter: Secondary | ICD-10-CM | POA: Diagnosis present

## 2015-03-06 DIAGNOSIS — Z8669 Personal history of other diseases of the nervous system and sense organs: Secondary | ICD-10-CM | POA: Insufficient documentation

## 2015-03-06 DIAGNOSIS — S93402A Sprain of unspecified ligament of left ankle, initial encounter: Secondary | ICD-10-CM | POA: Diagnosis not present

## 2015-03-06 DIAGNOSIS — E119 Type 2 diabetes mellitus without complications: Secondary | ICD-10-CM | POA: Insufficient documentation

## 2015-03-06 DIAGNOSIS — X58XXXA Exposure to other specified factors, initial encounter: Secondary | ICD-10-CM | POA: Diagnosis not present

## 2015-03-06 MED ORDER — TRAMADOL HCL 50 MG PO TABS: 50.0000 mg | ORAL_TABLET | Freq: Four times a day (QID) | ORAL | Status: DC | PRN

## 2015-03-06 MED ORDER — KETOROLAC TROMETHAMINE 10 MG PO TABS
10.0000 mg | ORAL_TABLET | Freq: Three times a day (TID) | ORAL | Status: DC
Start: 2015-03-06 — End: 2015-03-06

## 2015-03-06 NOTE — ED Notes (Signed)
PT stated she was stepping off of the porch and rolled her left ankle. PT c/o left foot and ankle pain with swelling noted but able to bear weight.

## 2015-03-06 NOTE — ED Notes (Signed)
Pt made aware to return if symptoms worsen or if any life threatening symptoms occur.   

## 2015-03-06 NOTE — ED Provider Notes (Signed)
CSN: [id number]     Arrival date & time 03/06/15  1116 History   First MD Initiated Contact with Patient 03/06/15 1155     Chief Complaint  Patient presents with  . Foot Injury     (Consider location/radiation/quality/duration/timing/severity/associated sxs/prior Treatment) Patient is a 64 y.o. female presenting with foot injury. The history is provided by the patient.  Foot Injury Location:  Ankle and foot Time since incident:  3 days Injury: yes   Mechanism of injury comment:  Stepped off a porch the wrong way. Ankle location:  L ankle Foot location:  L foot Pain details:    Quality:  Aching and shooting   Severity:  Moderate   Onset quality:  Sudden   Duration:  3 days   Timing:  Intermittent   Progression:  Worsening Chronicity:  New Dislocation: no   Relieved by:  Nothing Worsened by:  Bearing weight Ineffective treatments:  Acetaminophen Associated symptoms: stiffness   Associated symptoms: no numbness and no tingling     Past Medical History  Diagnosis Date  . Diabetes mellitus   . Hypertension   . Sleep apnea   . Hiatal hernia    Past Surgical History  Procedure Laterality Date  . Appendectomy    . Abdominal hysterectomy    . Cholecystectomy    . Breast surgery    . Tubal ligation    . Tonsillectomy    . Gastric bypass     History reviewed. No pertinent family history. Social History  Substance Use Topics  . Smoking status: Never Smoker   . Smokeless tobacco: None  . Alcohol Use: No   OB History    No data available     Review of Systems  Musculoskeletal: Positive for arthralgias and stiffness.  All other systems reviewed and are negative.     Allergies  Codeine; Ketorolac tromethamine; and Morphine and related  Home Medications   Prior to Admission medications   Medication Sig Start Date End Date Taking? Authorizing Provider  acetaminophen (TYLENOL) 500 MG tablet Take 500 mg by mouth every 6 (six) hours as needed.   Yes Historical  Provider, MD  amLODipine (NORVASC) 10 MG tablet Take 10 mg by mouth daily.     Yes Historical Provider, MD  B Complex-C (B-COMPLEX WITH VITAMIN C) tablet Take 1 tablet by mouth daily.   Yes Historical Provider, MD  labetalol (NORMODYNE) 200 MG tablet Take 1 tablet (200 mg total) by mouth 2 (two) times daily. 03/13/11  Yes Shelia Media, MD  Multiple Vitamins-Minerals (MULTIVITAMIN ADULT PO) Take 1 tablet by mouth daily.   Yes Historical Provider, MD  rosuvastatin (CRESTOR) 10 MG tablet Take 10 mg by mouth. 07/20/11  Yes Historical Provider, MD  valsartan (DIOVAN) 160 MG tablet Take 80 mg by mouth 2 (two) times daily.  03/01/15  Yes Historical Provider, MD  traMADol (ULTRAM) 50 MG tablet Take 1 tablet (50 mg total) by mouth every 6 (six) hours as needed. 03/06/15   Ivery Quale, PA-C   BP 183/72 mmHg  Pulse 68  Temp(Src) 98.4 F (36.9 C) (Oral)  Resp 18  Ht 5\' 4"  (1.626 m)  Wt 222 lb (100.699 kg)  BMI 38.09 kg/m2  SpO2 100% Physical Exam  Constitutional: She is oriented to person, place, and time. She appears well-developed and well-nourished.  Non-toxic appearance.  HENT:  Head: Normocephalic.  Right Ear: Tympanic membrane and external ear normal.  Left Ear: Tympanic membrane and external ear normal.  Eyes: EOM  and lids are normal. Pupils are equal, round, and reactive to light.  Neck: Normal range of motion. Neck supple. Carotid bruit is not present.  Cardiovascular: Normal rate, regular rhythm, normal heart sounds, intact distal pulses and normal pulses.   Pulmonary/Chest: Breath sounds normal. No respiratory distress.  Abdominal: Soft. Bowel sounds are normal. There is no tenderness. There is no guarding.  Musculoskeletal:       Left foot: There is decreased range of motion and tenderness. There is normal capillary refill and no deformity.       Feet:  Lymphadenopathy:       Head (right side): No submandibular adenopathy present.       Head (left side): No submandibular  adenopathy present.    She has no cervical adenopathy.  Neurological: She is alert and oriented to person, place, and time. She has normal strength. No cranial nerve deficit or sensory deficit.  Skin: Skin is warm and dry.  Psychiatric: She has a normal mood and affect. Her speech is normal.  Nursing note and vitals reviewed.   ED Course  Procedures (including critical care time) Labs Review Labs Reviewed - No data to display  Imaging Review Dg Ankle Complete Left  03/06/2015   CLINICAL DATA:  Left foot and ankle pain. Swelling. Stepped off the porch 3 days ago. Rolled her left ankle. History of gout.  EXAM: LEFT ANKLE COMPLETE - 3+ VIEW  COMPARISON:  None.  FINDINGS: There is diffuse soft tissue swelling of the ankle. No acute fracture or subluxation. Plantar and Achilles spurs are noted. Small bone island identified within the calcaneus.  IMPRESSION: 1. Soft tissue swelling. 2.  No evidence for acute osseous abnormality.   Electronically Signed   By: Norva Pavlov M.D.   On: 03/06/2015 12:21   Dg Foot Complete Left  03/06/2015   CLINICAL DATA:  Left foot and ankle pain with swelling. Rolled her ankle. Injury occurred 3 days ago.  EXAM: LEFT FOOT - COMPLETE 3+ VIEW  COMPARISON:  None.  FINDINGS: There is no evidence of fracture or dislocation. There is hallux valgus of the left foot with mild osteoarthritis of the first MTP joint. There is generalized osteopenia. There is a small plantar calcaneal spur. There are enthesopathic changes at the Achilles tendon insertion. Soft tissues are unremarkable.  IMPRESSION: No acute osseous injury of the left foot.   Electronically Signed   By: Elige Ko   On: 03/06/2015 12:25   I have personally reviewed and evaluated these images and lab results as part of my medical decision-making.   EKG Interpretation None      MDM  X-rays of the left ankle and left foot are negative for fracture or dislocation. There are noted multiple areas of arthritis.  I discussed the x-rays the exam findings and the x-ray findings with the patient in terms which he understands. The patient is fitted with a ankle stirrup splint, crutches, and advised to use Ultram every 6 hours as needed for pain not improved by extra strength Tylenol.    Final diagnoses:  Ankle sprain, left, initial encounter  DJD (degenerative joint disease), ankle and foot, left    **I have reviewed nursing notes, vital signs, and all appropriate lab and imaging results for this patient.Ivery Quale, PA-C 03/06/15 1400  Gilda Crease, MD 03/09/15 [phone]

## 2015-03-06 NOTE — Discharge Instructions (Signed)
Your x-ray is negative for fracture or dislocation. X-ray does reveal multiple sites of arthritis, including your Achilles tendon. Your examination favors a possible ankle strain. Please use your ankle stirrup splint for the next 7-10 days. Continue your arthritis rub. Use Tylenol every 4 hours for mild pain, use ultram for more severe pain. Ankle Sprain An ankle sprain is an injury to the strong, fibrous tissues (ligaments) that hold the bones of your ankle joint together.  CAUSES An ankle sprain is usually caused by a fall or by twisting your ankle. Ankle sprains most commonly occur when you step on the outer edge of your foot, and your ankle turns inward. People who participate in sports are more prone to these types of injuries.  SYMPTOMS   Pain in your ankle. The pain may be present at rest or only when you are trying to stand or walk.  Swelling.  Bruising. Bruising may develop immediately or within 1 to 2 days after your injury.  Difficulty standing or walking, particularly when turning corners or changing directions. DIAGNOSIS  Your caregiver will ask you details about your injury and perform a physical exam of your ankle to determine if you have an ankle sprain. During the physical exam, your caregiver will press on and apply pressure to specific areas of your foot and ankle. Your caregiver will try to move your ankle in certain ways. An X-ray exam may be done to be sure a bone was not broken or a ligament did not separate from one of the bones in your ankle (avulsion fracture).  TREATMENT  Certain types of braces can help stabilize your ankle. Your caregiver can make a recommendation for this. Your caregiver may recommend the use of medicine for pain. If your sprain is severe, your caregiver may refer you to a surgeon who helps to restore function to parts of your skeletal system (orthopedist) or a physical therapist. HOME CARE INSTRUCTIONS   Apply ice to your injury for 1-2 days or as  directed by your caregiver. Applying ice helps to reduce inflammation and pain.  Put ice in a plastic bag.  Place a towel between your skin and the bag.  Leave the ice on for 15-20 minutes at a time, every 2 hours while you are awake.  Only take over-the-counter or prescription medicines for pain, discomfort, or fever as directed by your caregiver.  Elevate your injured ankle above the level of your heart as much as possible for 2-3 days.  If your caregiver recommends crutches, use them as instructed. Gradually put weight on the affected ankle. Continue to use crutches or a cane until you can walk without feeling pain in your ankle.  If you have a plaster splint, wear the splint as directed by your caregiver. Do not rest it on anything harder than a pillow for the first 24 hours. Do not put weight on it. Do not get it wet. You may take it off to take a shower or bath.  You may have been given an elastic bandage to wear around your ankle to provide support. If the elastic bandage is too tight (you have numbness or tingling in your foot or your foot becomes cold and blue), adjust the bandage to make it comfortable.  If you have an air splint, you may blow more air into it or let air out to make it more comfortable. You may take your splint off at night and before taking a shower or bath. Wiggle your toes in  the splint several times per day to decrease swelling. SEEK MEDICAL CARE IF:   You have rapidly increasing bruising or swelling.  Your toes feel extremely cold or you lose feeling in your foot.  Your pain is not relieved with medicine. SEEK IMMEDIATE MEDICAL CARE IF:  Your toes are numb or blue.  You have severe pain that is increasing. MAKE SURE YOU:   Understand these instructions.  Will watch your condition.  Will get help right away if you are not doing well or get worse. Document Released: 06/19/2005 Document Revised: 03/13/2012 Document Reviewed: 07/01/2011 [facility]  Patient Information 2015 [place], [place]. This information is not intended to replace advice given to you by your health care provider. Make sure you discuss any questions you have with your health care provider.

## 2015-04-12 ENCOUNTER — Ambulatory Visit (INDEPENDENT_AMBULATORY_CARE_PROVIDER_SITE_OTHER): Payer: Medicare Other | Admitting: "Endocrinology

## 2015-04-12 ENCOUNTER — Encounter: Payer: Self-pay | Admitting: "Endocrinology

## 2015-04-12 VITALS — BP 128/78 | HR 64 | Ht 63.0 in | Wt 209.0 lb

## 2015-04-12 DIAGNOSIS — E1122 Type 2 diabetes mellitus with diabetic chronic kidney disease: Secondary | ICD-10-CM

## 2015-04-12 DIAGNOSIS — I10 Essential (primary) hypertension: Secondary | ICD-10-CM

## 2015-04-12 DIAGNOSIS — E119 Type 2 diabetes mellitus without complications: Secondary | ICD-10-CM | POA: Insufficient documentation

## 2015-04-12 DIAGNOSIS — N181 Chronic kidney disease, stage 1: Secondary | ICD-10-CM | POA: Diagnosis not present

## 2015-04-12 DIAGNOSIS — E785 Hyperlipidemia, unspecified: Secondary | ICD-10-CM | POA: Diagnosis not present

## 2015-04-12 DIAGNOSIS — E782 Mixed hyperlipidemia: Secondary | ICD-10-CM | POA: Insufficient documentation

## 2015-04-12 NOTE — Patient Instructions (Signed)

## 2015-04-12 NOTE — Progress Notes (Signed)
Subjective:    Patient ID: Allison Lara, female    DOB: 1950/12/03,    Past Medical History  Diagnosis Date  . Diabetes mellitus   . Hypertension   . Sleep apnea   . Hiatal hernia    Past Surgical History  Procedure Laterality Date  . Appendectomy    . Abdominal hysterectomy    . Cholecystectomy    . Breast surgery    . Tubal ligation    . Tonsillectomy    . Gastric bypass     Social History   Social History  . Marital Status: Married    Spouse Name: N/A  . Number of Children: N/A  . Years of Education: N/A   Social History Main Topics  . Smoking status: Never Smoker   . Smokeless tobacco: None  . Alcohol Use: No  . Drug Use: No  . Sexual Activity: Yes    Birth Control/ Protection: Post-menopausal   Other Topics Concern  . None   Social History Narrative   Outpatient Encounter Prescriptions as of 04/12/2015  Medication Sig  . acetaminophen (TYLENOL) 500 MG tablet Take 500 mg by mouth every 6 (six) hours as needed.  Marland Kitchen amLODipine (NORVASC) 10 MG tablet Take 10 mg by mouth daily.    . B Complex-C (B-COMPLEX WITH VITAMIN C) tablet Take 1 tablet by mouth daily.  Marland Kitchen labetalol (NORMODYNE) 200 MG tablet Take 1 tablet (200 mg total) by mouth 2 (two) times daily.  . Multiple Vitamins-Minerals (MULTIVITAMIN ADULT PO) Take 1 tablet by mouth daily.  . rosuvastatin (CRESTOR) 10 MG tablet Take 10 mg by mouth.  . valsartan (DIOVAN) 160 MG tablet Take 80 mg by mouth 2 (two) times daily.   . traMADol (ULTRAM) 50 MG tablet Take 1 tablet (50 mg total) by mouth every 6 (six) hours as needed. (Patient not taking: Reported on 04/12/2015)   No facility-administered encounter medications on file as of 04/12/2015.   ALLERGIES: Allergies  Allergen Reactions  . Aspirin   . Claritin [Loratadine]   . Codeine Nausea And Vomiting  . Ketorolac Tromethamine Other (See Comments)    Burns my stomach  . Morphine And Related Other (See Comments)    Tachycardia   VACCINATION  STATUS:  There is no immunization history on file for this patient.  HPI  The patient presents with a medical history as above. The patient is being seen in consultation for currently uncontrolled type 2 DM requested by Emilio Math OM, PA-C Patient was diagnosed with type 2 DM approximately at age 61  years. Patient's recent A1c was found to be 7.5 % (in May 2016) consistent with uncontrolled status.  She underwent bariatric surgery on 01/18/2015 prior to which her A1c was 8%. She lost approximately 50 pounds following her surgery. Patient denies  polydipsia, polyuria, and nocturia. Patient is currently on Lantus 30 units daily at bedtime, NovoLog 7 units 3 times a day before meals.she brought in meter which she shares with her sister who have uncontrolled diabetes, hence, unreliable. patient is also being treated for hypertension, hyperlipidemia and has been overweight to obese most of their adult life. Patient  she has stage I-II CK D , denies history of CAD, CVA, Neuropathy, and Retinopathy. Patient has  a family history of type 2 DM in her sister, parents, and grandparents. Patient is a non- smoker, does not  participate in a regular exercise program. Patient is willing to engage in intensive monitoring and therapy along with change  in life style.   Review of Systems  Constitutional: Negative for fatigue and unexpected weight change.       She underwent bariatric surgery on 01/18/2015. She lost approximately 50 pounds subsequently.  HENT: Negative for trouble swallowing and voice change.   Eyes: Negative for visual disturbance.  Respiratory: Negative for cough, shortness of breath and wheezing.   Cardiovascular: Negative for chest pain, palpitations and leg swelling.  Gastrointestinal: Negative for nausea, vomiting and diarrhea.  Endocrine: Negative for cold intolerance, heat intolerance, polydipsia, polyphagia and polyuria.  Musculoskeletal: Negative for myalgias and arthralgias.  Skin:  Negative for color change, pallor, rash and wound.  Neurological: Negative for seizures and headaches.  Psychiatric/Behavioral: Negative for suicidal ideas and confusion.    Objective:    BP 128/78 mmHg  Pulse 64  Ht  (1.6 m)  Wt 209 lb (94.802 kg)  BMI 37.03 kg/m2  SpO2 97%  Wt Readings from Last 3 Encounters:  04/12/15 209 lb (94.802 kg)  03/06/15 222 lb (100.699 kg)  07/09/11 223 lb (101.152 kg)    Physical Exam  Constitutional: She is oriented to person, place, and time. She appears well-developed.  HENT:  Head: Normocephalic and atraumatic.  Eyes: EOM are normal.  Neck: Normal range of motion. Neck supple. No tracheal deviation present. No thyromegaly present.  Cardiovascular: Normal rate and regular rhythm.   Pulmonary/Chest: Effort normal and breath sounds normal.  Abdominal: Soft. Bowel sounds are normal. There is no tenderness. There is no guarding.  Musculoskeletal: Normal range of motion. She exhibits no edema.  Neurological: She is alert and oriented to person, place, and time. She has normal reflexes. No cranial nerve deficit. Coordination normal.  Skin: Skin is warm and dry. No rash noted. No erythema. No pallor.  Psychiatric: She has a normal mood and affect. Judgment normal.    Results for orders placed or performed in visit on 04/12/15  Hemoglobin A1c  Result Value Ref Range   Hgb A1c MFr Bld 7.0 (A) 4.0 - 6.0 %   Complete Blood Count (Most recent): Lab Results  Component Value Date   WBC 9.6 07/10/2011   HGB 11.3* 07/10/2011   HCT 34.4* 07/10/2011   MCV 86.6 07/10/2011   PLT 329 07/10/2011   Chemistry (most recent): Lab Results  Component Value Date   NA 132* 07/10/2011   K 3.8 07/10/2011   CL 100 07/10/2011   CO2 25 07/10/2011   BUN 14 07/10/2011   CREATININE 1.03 07/10/2011   Diabetic Labs (most recent): Lab Results  Component Value Date   HGBA1C 7.0* 11/05/2014   HGBA1C 8.0* 07/09/2011   Lipid profile (most recent): No results  found for: TRIG, CHOL       Assessment & Plan:   1. Type 2 diabetes mellitus with stage 1 chronic kidney disease, without long-term current use of insulin (HCC  Her diabetes is complicated by mild renal failure. - Patient has currently uncontrolled symptomatic type 2 DM of approximately 18 years duration, with most recent A1c of 7.5 %. Recent labs reviewed.  -Patient remains at a high risk for more acute and chronic complications of diabetes which include CAD, CVA, CKD, retinopathy, and neuropathy. These are all discussed in detail with the patient.  - I have counseled the patient on diet management and weight loss, by adopting a carbohydrate restricted/protein rich diet.  - Patient is advised to stick to a routine mealtimes to eat 3 meals  a day and necessary snacks to avoid hypoglycemia.  -  Suggestion is made for patient to avoid simple carbohydrates   from their diet including Cakes , Desserts, Ice Cream,  Soda (  diet and regular) , Sweet Tea , Candies,  Chips, Cookies, Artificial Sweeteners,   and "Sugar-free" Products . This will help patient to have stable blood glucose profile and potentially avoid unintended weight gain.  - I encouraged the patient to switch to  unprocessed or minimally processed complex starch and increased protein intake (animal or plant source), fruits, and vegetables.   - I have approached patient with the following individualized plan to manage diabetes and patient agrees:   - I  will initiate strict monitoring of glucose  AC and HS. - Patient is warned not to take insulin without proper monitoring per orders. -I advised her to lower her Lantus to 20 units daily at bedtime and hold NovoLog for now. -She states she will have lab work next week at her PCP, if renal function returns to normal, she'll be considered for low-dose metformin.  -Patient is encouraged to call clinic for blood glucose levels less than 70 or above 300 mg /dl. -She is not a candidate  for SGLT2 inhibitors due to CKD.  - Patient will be considered for incretin therapy as appropriate next visit. - Patient specific target  A1c;  LDL, HDL, Triglycerides, and  Waist Circumference were discussed in detail.  2) BP/HTN: Controlled. Continue current medications including ARB. 3) Lipids/HPL: LDL  above target levels 105,  Continue crestor 10mg  po qhs. 4)  Obesity: She is status post bariatric surgery, lost 50 pounds so far. I encouraged her to stay engaged in the dietary recommendations, exercise, and carbohydrates information provided.  5) Chronic Care/Health Maintenance:  -Patient ARB and Statin medications and encouraged to continue to follow up with Ophthalmology, Podiatrist at least yearly or according to recommendations, and advised to  stay away from smoking. I have recommended yearly flu vaccine and pneumonia vaccination at least every 5 years; moderate intensity exercise for up to 150 minutes weekly; and  sleep for at least 7 hours a day.   Patient to bring meter and  blood glucose logs during their next visit. She has a scheduled lab work next week. We will request for a copy of that.   I advised patient to maintain close follow up with their PCP for primary care needs. Follow up plan: Return in about 2 weeks (around 04/26/2015) for diabetes, high blood pressure, high cholesterol, follow up with pre-visit labs, meter, and logs.  Marquis Lunch, MD Phone: [phone]  Fax: [phone]   04/12/2015, 3:16 PM

## 2015-04-15 LAB — HEMOGLOBIN A1C: HEMOGLOBIN A1C: 7.6 % — AB (ref 4.0–6.0)

## 2015-04-26 ENCOUNTER — Ambulatory Visit (INDEPENDENT_AMBULATORY_CARE_PROVIDER_SITE_OTHER): Payer: Medicare Other | Admitting: "Endocrinology

## 2015-04-26 ENCOUNTER — Encounter: Payer: Self-pay | Admitting: "Endocrinology

## 2015-04-26 VITALS — BP 109/67 | HR 83 | Ht 63.0 in | Wt 206.0 lb

## 2015-04-26 DIAGNOSIS — E1122 Type 2 diabetes mellitus with diabetic chronic kidney disease: Secondary | ICD-10-CM | POA: Diagnosis not present

## 2015-04-26 DIAGNOSIS — N181 Chronic kidney disease, stage 1: Secondary | ICD-10-CM | POA: Diagnosis not present

## 2015-04-26 DIAGNOSIS — I10 Essential (primary) hypertension: Secondary | ICD-10-CM

## 2015-04-26 DIAGNOSIS — E785 Hyperlipidemia, unspecified: Secondary | ICD-10-CM

## 2015-04-26 MED ORDER — METFORMIN HCL 500 MG PO TABS: 500.0000 mg | ORAL_TABLET | Freq: Two times a day (BID) | ORAL | Status: DC

## 2015-04-26 NOTE — Progress Notes (Signed)
Subjective:    Patient ID: Allison Lara, female    DOB: January 27, 1951,    Past Medical History  Diagnosis Date  . Diabetes mellitus   . Hypertension   . Sleep apnea   . Hiatal hernia    Past Surgical History  Procedure Laterality Date  . Appendectomy    . Abdominal hysterectomy    . Cholecystectomy    . Breast surgery    . Tubal ligation    . Tonsillectomy    . Gastric bypass     Social History   Social History  . Marital Status: Married    Spouse Name: N/A  . Number of Children: N/A  . Years of Education: N/A   Social History Main Topics  . Smoking status: Never Smoker   . Smokeless tobacco: None  . Alcohol Use: No  . Drug Use: No  . Sexual Activity: Yes    Birth Control/ Protection: Post-menopausal   Other Topics Concern  . None   Social History Narrative   Outpatient Encounter Prescriptions as of 04/26/2015  Medication Sig  . acetaminophen (TYLENOL) 500 MG tablet Take 500 mg by mouth every 6 (six) hours as needed.  Marland Kitchen allopurinol (ZYLOPRIM) 100 MG tablet Take 100 mg by mouth daily.  Marland Kitchen amLODipine (NORVASC) 10 MG tablet Take 10 mg by mouth daily.    . B Complex-C (B-COMPLEX WITH VITAMIN C) tablet Take 1 tablet by mouth daily.  . insulin glargine (LANTUS) 100 UNIT/ML injection Inject 15 Units into the skin at bedtime.  Marland Kitchen labetalol (NORMODYNE) 200 MG tablet Take 1 tablet (200 mg total) by mouth 2 (two) times daily.  . Multiple Vitamins-Minerals (MULTIVITAMIN ADULT PO) Take 1 tablet by mouth daily.  . rosuvastatin (CRESTOR) 10 MG tablet Take 10 mg by mouth.  . traMADol (ULTRAM) 50 MG tablet Take 1 tablet (50 mg total) by mouth every 6 (six) hours as needed.  . valsartan (DIOVAN) 160 MG tablet Take 80 mg by mouth 2 (two) times daily.   . metFORMIN (GLUCOPHAGE) 500 MG tablet Take 1 tablet (500 mg total) by mouth 2 (two) times daily with a meal.   No facility-administered encounter medications on file as of 04/26/2015.   ALLERGIES: Allergies  Allergen  Reactions  . Aspirin   . Claritin [Loratadine]   . Codeine Nausea And Vomiting  . Ketorolac Tromethamine Other (See Comments)    Burns my stomach  . Morphine And Related Other (See Comments)    Tachycardia   VACCINATION STATUS:  There is no immunization history on file for this patient.  HPI  The patient presents with a medical history as above. The patient is being seen in consultation for currently uncontrolled type 2 DM requested by Emilio Math OM, PA-C Patient was diagnosed with type 2 DM approximately at age 62  years. Patient's recent A1c was found to be 7.6 % (Oct.13, 2016). Most recent EAG is 131.  She underwent bariatric surgery on 01/18/2015 prior to which her A1c was 8%. She lost approximately 50 pounds following her surgery. Patient denies  polydipsia, polyuria, and nocturia. Patient is currently on Lantus 20 units daily at bedtime, . She is also being treated for hypertension, hyperlipidemia and has been overweight to obese most of their adult life. Patient  she has stage 1-2 CK D , denies history of CAD, CVA, Neuropathy, and Retinopathy. Patient has  a family history of type 2 DM in her sister, parents, and grandparents. Patient is a non- smoker,  does not  participate in a regular exercise program. Patient is willing to engage in intensive monitoring and therapy along with change in life style.   Review of Systems  Constitutional: Negative for fatigue and unexpected weight change.       She underwent bariatric surgery on 01/18/2015. She lost approximately 50 pounds subsequently.  HENT: Negative for trouble swallowing and voice change.   Eyes: Negative for visual disturbance.  Respiratory: Negative for cough, shortness of breath and wheezing.   Cardiovascular: Negative for chest pain, palpitations and leg swelling.  Gastrointestinal: Negative for nausea, vomiting and diarrhea.  Endocrine: Negative for cold intolerance, heat intolerance, polydipsia, polyphagia and  polyuria.  Musculoskeletal: Negative for myalgias and arthralgias.  Skin: Negative for color change, pallor, rash and wound.  Neurological: Negative for seizures and headaches.  Psychiatric/Behavioral: Negative for suicidal ideas and confusion.    Objective:    BP 109/67 mmHg  Pulse 83  Ht  (1.6 m)  Wt 206 lb (93.441 kg)  BMI 36.50 kg/m2  SpO2 95%  Wt Readings from Last 3 Encounters:  04/26/15 206 lb (93.441 kg)  04/12/15 209 lb (94.802 kg)  03/06/15 222 lb (100.699 kg)    Physical Exam  Constitutional: She is oriented to person, place, and time. She appears well-developed.  HENT:  Head: Normocephalic and atraumatic.  Eyes: EOM are normal.  Neck: Normal range of motion. Neck supple. No tracheal deviation present. No thyromegaly present.  Cardiovascular: Normal rate and regular rhythm.   Pulmonary/Chest: Effort normal and breath sounds normal.  Abdominal: Soft. Bowel sounds are normal. There is no tenderness. There is no guarding.  Musculoskeletal: Normal range of motion. She exhibits no edema.  Neurological: She is alert and oriented to person, place, and time. She has normal reflexes. No cranial nerve deficit. Coordination normal.  Skin: Skin is warm and dry. No rash noted. No erythema. No pallor.  Psychiatric: She has a normal mood and affect. Judgment normal.    Results for orders placed or performed in visit on 04/12/15  Hemoglobin A1c  Result Value Ref Range   Hgb A1c MFr Bld 7.0 (A) 4.0 - 6.0 %   Complete Blood Count (Most recent): Lab Results  Component Value Date   WBC 9.6 07/10/2011   HGB 11.3* 07/10/2011   HCT 34.4* 07/10/2011   MCV 86.6 07/10/2011   PLT 329 07/10/2011   Chemistry (most recent): Lab Results  Component Value Date   NA 132* 07/10/2011   K 3.8 07/10/2011   CL 100 07/10/2011   CO2 25 07/10/2011   BUN 14 07/10/2011   CREATININE 1.03 07/10/2011   Diabetic Labs (most recent): Lab Results  Component Value Date   HGBA1C 7.0*  11/05/2014   HGBA1C 8.0* 07/09/2011   Lipid profile (most recent): No results found for: TRIG, CHOL       Assessment & Plan:   1. Type 2 diabetes mellitus with stage 1 chronic kidney disease, without long-term current use of insulin (HCC She came with well controlled glucose average 131, most recent A1c is 7.6%.  -Patient remains at a high risk for more acute and chronic complications of diabetes which include CAD, CVA, CKD, retinopathy, and neuropathy. These are all discussed in detail with the patient.  - I have counseled the patient on diet management and weight loss, by adopting a carbohydrate restricted/protein rich diet.  - Patient is advised to stick to a routine mealtimes to eat 3 meals  a day and necessary snacks to  avoid hypoglycemia.  - Suggestion is made for patient to avoid simple carbohydrates   from their diet including Cakes , Desserts, Ice Cream,  Soda (  diet and regular) , Sweet Tea , Candies,  Chips, Cookies, Artificial Sweeteners,   and "Sugar-free" Products . This will help patient to have stable blood glucose profile and potentially avoid unintended weight gain.  - I encouraged the patient to switch to  unprocessed or minimally processed complex starch and increased protein intake (animal or plant source), fruits, and vegetables.   - I have approached patient with the following individualized plan to manage diabetes and patient agrees:    -I advised her to lower her Lantus to 15 units daily at bedtime and hold NovoLog for now. -She states she will have lab work next week at her PCP, if renal function returns to normal, she'll be considered for low-dose metformin. -I will initiate low-dose Glucophage 500 mg by mouth twice a day, side effects and precaution discussed with patient. -Patient is encouraged to call clinic for blood glucose levels less than 70 or above 300 mg /dl.  - Patient will be considered for incretin therapy as appropriate next visit. - Patient  specific target  A1c;  LDL, HDL, Triglycerides, and  Waist Circumference were discussed in detail.  2) BP/HTN: Controlled. Continue current medications including ARB. 3) Lipids/HPL: LDL  above target levels 105,  Continue crestor 10mg  po qhs. 4)  Obesity: She is status post bariatric surgery, lost 50 pounds so far. I encouraged her to stay engaged in the dietary recommendations, exercise, and carbohydrates information provided.  5) Chronic Care/Health Maintenance:  -Patient ARB and Statin medications and encouraged to continue to follow up with Ophthalmology, Podiatrist at least yearly or according to recommendations, and advised to  stay away from smoking. I have recommended yearly flu vaccine and pneumonia vaccination at least every 5 years; moderate intensity exercise for up to 150 minutes weekly; and  sleep for at least 7 hours a day.   Patient to bring meter and  blood glucose logs during their next visit. She has a scheduled lab work next week. We will request for a copy of that.   I advised patient to maintain close follow up with their PCP for primary care needs. Follow up plan: Return in about 3 months (around 07/27/2015) for diabetes, high blood pressure, high cholesterol, follow up with pre-visit labs, meter, and logs.  Marquis LunchGebre Shelsy Seng, MD Phone: [phone]  Fax: [phone]   04/26/2015, 8:05 PM

## 2015-04-26 NOTE — Patient Instructions (Signed)

## 2015-05-07 ENCOUNTER — Other Ambulatory Visit: Payer: Self-pay

## 2015-05-07 MED ORDER — METFORMIN HCL 500 MG PO TABS: 500.0000 mg | ORAL_TABLET | Freq: Two times a day (BID) | ORAL | Status: DC

## 2015-05-31 ENCOUNTER — Ambulatory Visit: Payer: Medicare Other | Admitting: Nutrition

## 2015-06-30 ENCOUNTER — Ambulatory Visit: Payer: Medicare Other | Admitting: Nutrition

## 2015-07-30 ENCOUNTER — Ambulatory Visit: Payer: Medicare Other | Admitting: "Endocrinology

## 2015-08-16 ENCOUNTER — Other Ambulatory Visit: Payer: Self-pay

## 2015-08-16 MED ORDER — METFORMIN HCL 500 MG PO TABS: 500.0000 mg | ORAL_TABLET | Freq: Two times a day (BID) | ORAL | Status: DC

## 2015-12-25 ENCOUNTER — Emergency Department (HOSPITAL_COMMUNITY)
Admission: EM | Admit: 2015-12-25 | Discharge: 2015-12-25 | Disposition: A | Discharge status: Home / Self Care | Payer: Medicare Other | Attending: Emergency Medicine | Admitting: Emergency Medicine

## 2015-12-25 ENCOUNTER — Encounter (HOSPITAL_COMMUNITY): Payer: Self-pay | Admitting: *Deleted

## 2015-12-25 ENCOUNTER — Emergency Department (HOSPITAL_COMMUNITY): Payer: Medicare Other

## 2015-12-25 DIAGNOSIS — K432 Incisional hernia without obstruction or gangrene: Secondary | ICD-10-CM | POA: Diagnosis not present

## 2015-12-25 DIAGNOSIS — Z79899 Other long term (current) drug therapy: Secondary | ICD-10-CM | POA: Diagnosis not present

## 2015-12-25 DIAGNOSIS — Z794 Long term (current) use of insulin: Secondary | ICD-10-CM | POA: Diagnosis not present

## 2015-12-25 DIAGNOSIS — E119 Type 2 diabetes mellitus without complications: Secondary | ICD-10-CM | POA: Diagnosis not present

## 2015-12-25 DIAGNOSIS — I10 Essential (primary) hypertension: Secondary | ICD-10-CM | POA: Insufficient documentation

## 2015-12-25 DIAGNOSIS — R109 Unspecified abdominal pain: Secondary | ICD-10-CM | POA: Diagnosis present

## 2015-12-25 LAB — LIPASE, BLOOD: LIPASE: 28 U/L (ref 11–51)

## 2015-12-25 LAB — CBC WITH DIFFERENTIAL/PLATELET
Basophils Absolute: 0 10*3/uL (ref 0.0–0.1)
Basophils Relative: 0 %
EOS ABS: 0.2 10*3/uL (ref 0.0–0.7)
EOS PCT: 2 %
HCT: 39.8 % (ref 36.0–46.0)
Hemoglobin: 13.4 g/dL (ref 12.0–15.0)
LYMPHS ABS: 2.2 10*3/uL (ref 0.7–4.0)
LYMPHS PCT: 24 %
MCH: 29.8 pg (ref 26.0–34.0)
MCHC: 33.7 g/dL (ref 30.0–36.0)
MCV: 88.6 fL (ref 78.0–100.0)
MONO ABS: 0.5 10*3/uL (ref 0.1–1.0)
MONOS PCT: 6 %
Neutro Abs: 6.5 10*3/uL (ref 1.7–7.7)
Neutrophils Relative %: 68 %
PLATELETS: 273 10*3/uL (ref 150–400)
RBC: 4.49 MIL/uL (ref 3.87–5.11)
RDW: 12.8 % (ref 11.5–15.5)
WBC: 9.5 10*3/uL (ref 4.0–10.5)

## 2015-12-25 LAB — COMPREHENSIVE METABOLIC PANEL
ALBUMIN: 4.6 g/dL (ref 3.5–5.0)
ALT: 46 U/L (ref 14–54)
AST: 33 U/L (ref 15–41)
Alkaline Phosphatase: 99 U/L (ref 38–126)
Anion gap: 8 (ref 5–15)
BUN: 19 mg/dL (ref 6–20)
CHLORIDE: 105 mmol/L (ref 101–111)
CO2: 27 mmol/L (ref 22–32)
CREATININE: 0.9 mg/dL (ref 0.44–1.00)
Calcium: 9.4 mg/dL (ref 8.9–10.3)
GFR calc Af Amer: >60 mL/min (ref >60)
GLUCOSE: 123 mg/dL — AB (ref 65–99)
Potassium: 4 mmol/L (ref 3.5–5.1)
Sodium: 140 mmol/L (ref 135–145)
Total Bilirubin: 0.8 mg/dL (ref 0.3–1.2)
Total Protein: 8.2 g/dL — ABNORMAL HIGH (ref 6.5–8.1)

## 2015-12-25 LAB — URINALYSIS, ROUTINE W REFLEX MICROSCOPIC
BILIRUBIN URINE: NEGATIVE
GLUCOSE, UA: NEGATIVE mg/dL
Ketones, ur: NEGATIVE mg/dL
Leukocytes, UA: NEGATIVE
Nitrite: NEGATIVE
PH: 7 (ref 5.0–8.0)
Protein, ur: 100 mg/dL — AB
SPECIFIC GRAVITY, URINE: 1.015 (ref 1.005–1.030)

## 2015-12-25 LAB — URINE MICROSCOPIC-ADD ON

## 2015-12-25 MED ORDER — DIATRIZOATE MEGLUMINE & SODIUM 66-10 % PO SOLN
ORAL | Status: AC
  Filled 2015-12-25: qty 30

## 2015-12-25 MED ORDER — ONDANSETRON HCL 4 MG/2ML IJ SOLN
4.0000 mg | Freq: Once | INTRAMUSCULAR | Status: AC
  Administered 2015-12-25: 4 mg via INTRAVENOUS
  Filled 2015-12-25: qty 2

## 2015-12-25 MED ORDER — IOPAMIDOL (ISOVUE-300) INJECTION 61%
100.0000 mL | Freq: Once | INTRAVENOUS | Status: AC | PRN
  Administered 2015-12-25: 100 mL via INTRAVENOUS

## 2015-12-25 MED ORDER — LORAZEPAM 2 MG/ML IJ SOLN
1.0000 mg | Freq: Once | INTRAMUSCULAR | Status: DC
  Filled 2015-12-25: qty 1

## 2015-12-25 MED ORDER — HYDROMORPHONE HCL 1 MG/ML IJ SOLN
1.0000 mg | Freq: Once | INTRAMUSCULAR | Status: AC
  Administered 2015-12-25: 0.5 mg via INTRAVENOUS
  Filled 2015-12-25: qty 1

## 2015-12-25 NOTE — Discharge Instructions (Signed)
Follow-up with your general surgeon or Dr. Lovell [place]Jenkins [place] in [place] and one 2 weeks return if the hernia comes back will not be pushed back and

## 2015-12-25 NOTE — ED Notes (Addendum)
Pt started having pain in her abdomen today. Pt has states she has a hernia in her abdomen that has been there for a year and feels like that is what is bothering her.  Pt states she has had one episode of emesis today.

## 2015-12-25 NOTE — ED Provider Notes (Signed)
CSN: [id number]     Arrival date & time 12/25/15  1149 History   First MD Initiated Contact with Patient 12/25/15 1206     Chief Complaint  Patient presents with  . Abdominal Pain     (Consider location/radiation/quality/duration/timing/severity/associated sxs/prior Treatment) Patient is a 65 y.o. female presenting with abdominal pain. The history is provided by the patient (Patient complains of abdominal pain from her hernia).  Abdominal Pain Pain location:  Epigastric Pain quality: aching   Pain radiates to:  Does not radiate Pain severity:  Moderate Onset quality:  Sudden Timing:  Constant Progression:  Waxing and waning Chronicity:  Recurrent Context: not alcohol use   Associated symptoms: no chest pain, no cough, no diarrhea, no fatigue and no hematuria     Past Medical History  Diagnosis Date  . Diabetes mellitus   . Hypertension   . Sleep apnea   . Hiatal hernia    Past Surgical History  Procedure Laterality Date  . Appendectomy    . Abdominal hysterectomy    . Cholecystectomy    . Breast surgery    . Tubal ligation    . Tonsillectomy    . Gastric bypass     Family History  Problem Relation Age of Onset  . Kidney disease Mother   . Diabetes Mother    Social History  Substance Use Topics  . Smoking status: Never Smoker   . Smokeless tobacco: None  . Alcohol Use: No   OB History    No data available     Review of Systems  Constitutional: Negative for appetite change and fatigue.  HENT: Negative for congestion, ear discharge and sinus pressure.   Eyes: Negative for discharge.  Respiratory: Negative for cough.   Cardiovascular: Negative for chest pain.  Gastrointestinal: Positive for abdominal pain. Negative for diarrhea.  Genitourinary: Negative for frequency and hematuria.  Musculoskeletal: Negative for back pain.  Skin: Negative for rash.  Neurological: Negative for seizures and headaches.  Psychiatric/Behavioral: Negative for hallucinations.       Allergies  Aspirin; Claritin; Codeine; Ketorolac tromethamine; and Morphine and related  Home Medications   Prior to Admission medications   Medication Sig Start Date End Date Taking? Authorizing Provider  amLODipine (NORVASC) 10 MG tablet Take 10 mg by mouth daily.     Yes Historical Provider, MD  colchicine 0.6 MG tablet Take 0.6 mg by mouth daily.   Yes Historical Provider, MD  Insulin Glargine (LANTUS SOLOSTAR) 100 UNIT/ML Solostar Pen Inject 34 Units into the skin at bedtime.   Yes Historical Provider, MD  labetalol (NORMODYNE) 200 MG tablet Take 1 tablet (200 mg total) by mouth 2 (two) times daily. 03/13/11  Yes Shelia Media, MD  Multiple Vitamins-Minerals (MULTIVITAMIN ADULT PO) Take 3 tablets by mouth daily.    Yes Historical Provider, MD  rosuvastatin (CRESTOR) 10 MG tablet Take 10 mg by mouth. 07/20/11  Yes Historical Provider, MD  valsartan (DIOVAN) 160 MG tablet Take 80 mg by mouth 2 (two) times daily.  03/01/15  Yes Historical Provider, MD  acetaminophen (TYLENOL) 500 MG tablet Take 500 mg by mouth every 6 (six) hours as needed for mild pain.     Historical Provider, MD  allopurinol (ZYLOPRIM) 100 MG tablet Take 100 mg by mouth daily.    Historical Provider, MD  B Complex-C (B-COMPLEX WITH VITAMIN C) tablet Take 1 tablet by mouth daily.    Historical Provider, MD  insulin glargine (LANTUS) 100 UNIT/ML injection Inject 15 Units into  the skin at bedtime.    Historical Provider, MD  metFORMIN (GLUCOPHAGE) 500 MG tablet Take 1 tablet (500 mg total) by mouth 2 (two) times daily with a meal. 08/16/15   Roma KayserGebreselassie W Nida, MD  traMADol (ULTRAM) 50 MG tablet Take 1 tablet (50 mg total) by mouth every 6 (six) hours as needed. 03/06/15   Ivery QualeHobson Bryant, PA-C   BP 167/76 mmHg  Pulse 77  Temp(Src) 99.1 F (37.3 C) (Oral)  Resp 18  Ht 5\' 4"  (1.626 m)  Wt 192 lb (87.091 kg)  BMI 32.94 kg/m2  SpO2 99% Physical Exam  Constitutional: She is oriented to person, place, and time.  She appears well-developed.  HENT:  Head: Normocephalic.  Eyes: Conjunctivae and EOM are normal. No scleral icterus.  Neck: Neck supple. No thyromegaly present.  Cardiovascular: Normal rate and regular rhythm.  Exam reveals no gallop and no friction rub.   No murmur heard. Pulmonary/Chest: No stridor. She has no wheezes. She has no rales. She exhibits no tenderness.  Abdominal: She exhibits no distension. There is tenderness. There is no rebound.  Patient has an abdominal hernia protruding superior to umbilicus  Musculoskeletal: Normal range of motion. She exhibits no edema.  Lymphadenopathy:    She has no cervical adenopathy.  Neurological: She is oriented to person, place, and time. She exhibits normal muscle tone. Coordination normal.  Skin: No rash noted. No erythema.  Psychiatric: She has a normal mood and affect. Her behavior is normal.    ED Course  Procedures (including critical care time) Labs Review Labs Reviewed  COMPREHENSIVE METABOLIC PANEL - Abnormal; Notable for the following:    Glucose, Bld 123 (*)    Total Protein 8.2 (*)    All other components within normal limits  URINALYSIS, ROUTINE W REFLEX MICROSCOPIC (NOT AT [facility]) - Abnormal; Notable for the following:    Hgb urine dipstick TRACE (*)    Protein, ur 100 (*)    All other components within normal limits  URINE MICROSCOPIC-ADD ON - Abnormal; Notable for the following:    Squamous Epithelial / LPF 0-5 (*)    Bacteria, UA RARE (*)    All other components within normal limits  LIPASE, BLOOD  CBC WITH DIFFERENTIAL/PLATELET    Imaging Review Ct Abdomen Pelvis W Contrast  12/25/2015  CLINICAL DATA:  Abdominal pain with single episode of vomiting. EXAM: CT ABDOMEN AND PELVIS WITH CONTRAST TECHNIQUE: Multidetector CT imaging of the abdomen and pelvis was performed using the standard protocol following bolus administration of intravenous contrast. CONTRAST:  100mL ISOVUE-300 IOPAMIDOL (ISOVUE-300) INJECTION 61%  COMPARISON:  07/08/2014 unenhanced CT of the abdomen and pelvis. FINDINGS: Lower chest:  No acute findings. Hepatobiliary: The liver is unremarkable. The gallbladder has been removed. The common bile duct measures up to 12 mm in diameter. This is likely related to prior cholecystectomy. No calculi identified in the bile ducts. Pancreas: No mass, inflammatory changes, or other significant abnormality. Spleen: Within normal limits in size and appearance. Adrenals/Urinary Tract: No masses identified. No evidence of hydronephrosis. No urinary tract calculi identified. The bladder is unremarkable. Stomach/Bowel: Evidence of Roux-en-Y gastric bypass with no evidence of surgical complication or bowel obstruction. No free air or abscess is identified. No abnormal small bowel loops. Sigmoid diverticulosis noted without evidence of acute diverticulitis. Vascular/Lymphatic: No pathologically enlarged lymph nodes. No evidence of abdominal aortic aneurysm. Reproductive: Evidence of hysterectomy. Other: Ventral hernia defect in the midline abdominal wall is present just superior to the umbilicus. This hernia  defect contains fat and no bowel or fluid. Musculoskeletal: Mild proliferative changes of the lumbar spine. No focal bony lesions or evidence of fracture. IMPRESSION: No acute findings in the abdomen or pelvis. 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 Ventral hernia just superior to the umbilicus contains fat. Electronically Signed   By: Irish LackGlenn  Yamagata M.D.   On: 12/25/2015 15:19   I have personally reviewed and evaluated these images and lab results as part of my medical decision-making.   EKG Interpretation None      MDM   Final diagnoses:  Recurrent ventral hernia    Patient's abdominal hernia has been reduced in the emergency department. Patient feels much better. CT of abdomen shows the ventral hernia superior to the umbilicus    Bethann BerkshireJoseph Alberta Cairns, MD 12/25/15 1538

## 2016-11-02 ENCOUNTER — Other Ambulatory Visit: Payer: Self-pay | Admitting: "Endocrinology

## 2016-11-02 DIAGNOSIS — E1165 Type 2 diabetes mellitus with hyperglycemia: Secondary | ICD-10-CM

## 2016-11-02 DIAGNOSIS — E118 Type 2 diabetes mellitus with unspecified complications: Principal | ICD-10-CM

## 2016-11-21 ENCOUNTER — Ambulatory Visit: Payer: Medicare Other | Admitting: "Endocrinology

## 2016-12-04 ENCOUNTER — Telehealth: Payer: Self-pay

## 2016-12-04 NOTE — Telephone Encounter (Signed)
Pt received a letter from [facility] to set up colonoscopy.  [phone]

## 2016-12-05 ENCOUNTER — Encounter: Payer: Self-pay | Admitting: "Endocrinology

## 2016-12-05 ENCOUNTER — Ambulatory Visit: Payer: Medicare Other | Admitting: "Endocrinology

## 2016-12-06 ENCOUNTER — Telehealth: Payer: Self-pay

## 2016-12-06 NOTE — Telephone Encounter (Signed)
Pt called again this morning wanting to schedule her colonoscopy. I told her that I had sent a phone note to the triage nurse yesterday and she would be getting in touch with her. I told her [facility] does triages in the afternoons and pt is not having any GI problems or taking any blood thinners. Please call [phone]

## 2016-12-06 NOTE — Telephone Encounter (Signed)
See note of 12/06/2016.

## 2016-12-11 NOTE — Telephone Encounter (Signed)
LMOM for a return call.  

## 2016-12-11 NOTE — Telephone Encounter (Signed)
I called pt and she is not having any problems. Said [place] She did not have polyps and no family hx of colon cancer. She thought she had to have one annually for medicaid and I told her they will not pay for you to have one yearly if there is not reason. She will try to get the report for me.

## 2016-12-14 NOTE — Telephone Encounter (Signed)
I received a call from Trish at pt's PCP. Rosann Auerbachrish said they do not have any report in current system about a recent colonoscopy. She will look in older system and let me know.

## 2016-12-18 ENCOUNTER — Emergency Department (HOSPITAL_COMMUNITY)
Admission: EM | Admit: 2016-12-18 | Discharge: 2016-12-18 | Disposition: A | Discharge status: Home / Self Care | Payer: Medicare Other | Attending: Emergency Medicine | Admitting: Emergency Medicine

## 2016-12-18 ENCOUNTER — Encounter (HOSPITAL_COMMUNITY): Payer: Self-pay | Admitting: Emergency Medicine

## 2016-12-18 ENCOUNTER — Emergency Department (HOSPITAL_COMMUNITY): Payer: Medicare Other

## 2016-12-18 DIAGNOSIS — Z794 Long term (current) use of insulin: Secondary | ICD-10-CM | POA: Insufficient documentation

## 2016-12-18 DIAGNOSIS — N181 Chronic kidney disease, stage 1: Secondary | ICD-10-CM | POA: Diagnosis not present

## 2016-12-18 DIAGNOSIS — I129 Hypertensive chronic kidney disease with stage 1 through stage 4 chronic kidney disease, or unspecified chronic kidney disease: Secondary | ICD-10-CM | POA: Insufficient documentation

## 2016-12-18 DIAGNOSIS — Z79899 Other long term (current) drug therapy: Secondary | ICD-10-CM | POA: Insufficient documentation

## 2016-12-18 DIAGNOSIS — E0822 Diabetes mellitus due to underlying condition with diabetic chronic kidney disease: Secondary | ICD-10-CM | POA: Diagnosis not present

## 2016-12-18 DIAGNOSIS — R52 Pain, unspecified: Secondary | ICD-10-CM

## 2016-12-18 DIAGNOSIS — N200 Calculus of kidney: Secondary | ICD-10-CM

## 2016-12-18 DIAGNOSIS — R1031 Right lower quadrant pain: Secondary | ICD-10-CM | POA: Diagnosis present

## 2016-12-18 HISTORY — DX: Disorder of kidney and ureter, unspecified: N28.9

## 2016-12-18 LAB — URINALYSIS, ROUTINE W REFLEX MICROSCOPIC
BILIRUBIN URINE: NEGATIVE
GLUCOSE, UA: NEGATIVE mg/dL
KETONES UR: NEGATIVE mg/dL
LEUKOCYTES UA: NEGATIVE
NITRITE: NEGATIVE
PH: 5 (ref 5.0–8.0)
Protein, ur: 30 mg/dL — AB
SPECIFIC GRAVITY, URINE: 1.016 (ref 1.005–1.030)

## 2016-12-18 LAB — BASIC METABOLIC PANEL
Anion gap: 9 (ref 5–15)
BUN: 25 mg/dL — AB (ref 6–20)
CO2: 27 mmol/L (ref 22–32)
CREATININE: 0.82 mg/dL (ref 0.44–1.00)
Calcium: 9.4 mg/dL (ref 8.9–10.3)
Chloride: 104 mmol/L (ref 101–111)
GFR calc Af Amer: >60 mL/min (ref >60)
GLUCOSE: 143 mg/dL — AB (ref 65–99)
POTASSIUM: 4.7 mmol/L (ref 3.5–5.1)
SODIUM: 140 mmol/L (ref 135–145)

## 2016-12-18 LAB — CBC
HCT: 40.3 % (ref 36.0–46.0)
Hemoglobin: 13.3 g/dL (ref 12.0–15.0)
MCH: 29.1 pg (ref 26.0–34.0)
MCHC: 33 g/dL (ref 30.0–36.0)
MCV: 88.2 fL (ref 78.0–100.0)
PLATELETS: 274 10*3/uL (ref 150–400)
RBC: 4.57 MIL/uL (ref 3.87–5.11)
RDW: 13.4 % (ref 11.5–15.5)
WBC: 8.3 10*3/uL (ref 4.0–10.5)

## 2016-12-18 MED ORDER — HYDROMORPHONE HCL 1 MG/ML IJ SOLN
1.0000 mg | Freq: Once | INTRAMUSCULAR | Status: AC
  Administered 2016-12-18: 1 mg via INTRAMUSCULAR

## 2016-12-18 MED ORDER — TAMSULOSIN HCL 0.4 MG PO CAPS: 0.4000 mg | ORAL_CAPSULE | Freq: Every day | ORAL | 0 refills | Status: AC

## 2016-12-18 MED ORDER — ONDANSETRON 4 MG PO TBDP: ORAL_TABLET | ORAL | 0 refills | Status: AC

## 2016-12-18 MED ORDER — HYDROMORPHONE HCL 1 MG/ML IJ SOLN
1.0000 mg | Freq: Once | INTRAMUSCULAR | Status: DC
  Filled 2016-12-18: qty 1

## 2016-12-18 MED ORDER — OXYCODONE-ACETAMINOPHEN 5-325 MG PO TABS: 1.0000 | ORAL_TABLET | Freq: Four times a day (QID) | ORAL | 0 refills | Status: AC | PRN

## 2016-12-18 NOTE — ED Notes (Signed)
Called CT for update on time

## 2016-12-18 NOTE — ED Notes (Signed)
Pt states she went to bathroom while waiting and feels better at present.

## 2016-12-18 NOTE — ED Notes (Signed)
Patient given discharge instruction, verbalized understand. Patient ambulatory out of the department.  

## 2016-12-18 NOTE — ED Provider Notes (Signed)
[facility]ENCY DEPT Provider Note   CSN: [id number] Arrival date & time: 12/18/16  1340     History   Chief Complaint Chief Complaint  Patient presents with  . Flank Pain    HPI Allison Lara is a 66 y.o. female.  Patient complains of right flank pain. Patient states she has had a kidney stone before. No vomiting mild nausea   The history is provided by the patient.  Flank Pain  This is a new problem. The current episode started 12 to 24 hours ago. The problem occurs constantly. The problem has not changed since onset.Pertinent negatives include no chest pain, no abdominal pain and no headaches. Nothing aggravates the symptoms. Nothing relieves the symptoms. She has tried nothing for the symptoms. The treatment provided no relief.    Past Medical History:  Diagnosis Date  . Diabetes mellitus   . Hiatal hernia   . Hypertension   . Renal disorder    kidney stones  . Sleep apnea     Patient Active Problem List   Diagnosis Date Noted  . Type 2 diabetes mellitus with stage 1 chronic kidney disease, without long-term current use of insulin (HCC) 04/12/2015  . Hyperlipidemia 04/12/2015  . Diverticulitis of sigmoid colon 07/09/2011  . DM (diabetes mellitus) (HCC) 07/09/2011  . HTN (hypertension) 07/09/2011    Past Surgical History:  Procedure Laterality Date  . ABDOMINAL HYSTERECTOMY    . APPENDECTOMY    . BREAST SURGERY    . CHOLECYSTECTOMY    . GASTRIC BYPASS    . HERNIA REPAIR    . TONSILLECTOMY    . tubal ligation      OB History    Gravida Para Term Preterm AB Living             3   SAB TAB Ectopic Multiple Live Births                   Home Medications    Prior to Admission medications   Medication Sig Start Date End Date Taking? Authorizing Provider  acetaminophen (TYLENOL) 500 MG tablet Take 500 mg by mouth every 6 (six) hours as needed for mild pain.    Yes [provider]  amLODipine (NORVASC) 10 MG tablet Take 10 mg by mouth daily.      Yes [provider]  B Complex-C (B-COMPLEX WITH VITAMIN C) tablet Take 1 tablet by mouth daily.   Yes [provider]  colchicine 0.6 MG tablet Take 0.6 mg by mouth daily.   Yes [provider]  Insulin Glargine (LANTUS SOLOSTAR) 100 UNIT/ML Solostar Pen Inject 35 Units into the skin at bedtime.    Yes [provider]  labetalol (NORMODYNE) 200 MG tablet Take 1 tablet (200 mg total) by mouth 2 (two) times daily. Patient taking differently: Take 200 mg by mouth daily.  03/13/11  Yes Shelia MediaWalker, Jennifer A, MD  Multiple Vitamins-Minerals (MULTIVITAMIN ADULT PO) Take 3 tablets by mouth daily.    Yes [provider]  rosuvastatin (CRESTOR) 10 MG tablet Take 10 mg by mouth daily.  07/20/11  Yes [provider]  valsartan (DIOVAN) 160 MG tablet Take 160 mg by mouth every evening.  03/01/15  Yes [provider]  ondansetron (ZOFRAN ODT) 4 MG disintegrating tablet 4mg  ODT q4 hours prn nausea/vomit 12/18/16   Bethann BerkshireZammit, Lumen Brinlee, MD  oxyCODONE-acetaminophen (PERCOCET/ROXICET) 5-325 MG tablet Take 1 tablet by mouth every 6 (six) hours as needed. 12/18/16   Bethann BerkshireZammit, Jibril Mcminn, MD  tamsulosin (FLOMAX) 0.4 MG CAPS capsule Take 1 capsule (0.4 mg total) by mouth daily. 12/18/16   Bethann Berkshire, MD    Family History Family History  Problem Relation Age of Onset  . Kidney disease Mother   . Diabetes Mother     Social History Social History  Substance Use Topics  . Smoking status: Never Smoker  . Smokeless tobacco: Never Used  . Alcohol use No     Allergies   Aspirin; Claritin [loratadine]; Codeine; Ketorolac tromethamine; and Morphine and related   Review of Systems Review of Systems  Constitutional: Negative for appetite change and fatigue.  HENT: Negative for congestion, ear discharge and sinus pressure.   Eyes: Negative for discharge.  Respiratory: Negative for cough.   Cardiovascular: Negative for chest pain.  Gastrointestinal: Negative for  abdominal pain and diarrhea.  Genitourinary: Positive for flank pain. Negative for frequency and hematuria.  Musculoskeletal: Negative for back pain.  Skin: Negative for rash.  Neurological: Negative for seizures and headaches.  Psychiatric/Behavioral: Negative for hallucinations.     Physical Exam Updated Vital Signs BP (!) 197/91 (BP Location: Left Arm)   Pulse 61   Temp 98.2 F (36.8 C) (Oral)   Resp 17   Ht 5\' 3"  (1.6 m)   Wt 80.7 kg (178 lb)   SpO2 100%   BMI 31.53 kg/m   Physical Exam  Constitutional: She is oriented to person, place, and time. She appears well-developed.  HENT:  Head: Normocephalic.  Eyes: Conjunctivae and EOM are normal. No scleral icterus.  Neck: Neck supple. No thyromegaly present.  Cardiovascular: Normal rate and regular rhythm.  Exam reveals no gallop and no friction rub.   No murmur heard. Pulmonary/Chest: No stridor. She has no wheezes. She has no rales. She exhibits no tenderness.  Abdominal: She exhibits no distension. There is no tenderness. There is no rebound.  Genitourinary:  Genitourinary Comments: Tender right flank  Musculoskeletal: Normal range of motion. She exhibits no edema.  Lymphadenopathy:    She has no cervical adenopathy.  Neurological: She is oriented to person, place, and time. She exhibits normal muscle tone. Coordination normal.  Skin: No rash noted. No erythema.  Psychiatric: She has a normal mood and affect. Her behavior is normal.     ED Treatments / Results  Labs (all labs ordered are listed, but only abnormal results are displayed) Labs Reviewed  URINALYSIS, ROUTINE W REFLEX MICROSCOPIC - Abnormal; Notable for the following:       Result Value   APPearance HAZY (*)    Hgb urine dipstick LARGE (*)    Protein, ur 30 (*)    Bacteria, UA RARE (*)    Squamous Epithelial / LPF 0-5 (*)    All other components within normal limits  BASIC METABOLIC PANEL - Abnormal; Notable for the following:    Glucose, Bld 143  (*)    BUN 25 (*)    All other components within normal limits  CBC    EKG  EKG Interpretation None       Radiology Ct Renal Stone Study  Result Date: 12/18/2016 CLINICAL DATA:  Right-sided flank pain with nausea EXAM: CT ABDOMEN AND PELVIS WITHOUT CONTRAST TECHNIQUE: Multidetector CT imaging of the abdomen and pelvis was performed following the standard protocol without oral or intravenous contrast material administration. COMPARISON:  December 25, 2015 FINDINGS: Lower chest: Lung bases are clear. There are foci of coronary artery calcification. There is a small hiatal hernia. Hepatobiliary: No focal liver lesions are  apparent on this noncontrast enhanced study. Gallbladder is absent. There is no appreciable biliary duct dilatation. Pancreas: No pancreatic mass or inflammatory focus. Spleen: No splenic lesions are evident. Adrenals/Urinary Tract: Adrenals appear unremarkable bilaterally. Each kidney shows fetal lobulation, an anatomic variant. There is no renal mass on either side. There is moderate hydronephrosis on the right. There is no hydronephrosis on the left. There is a 5 x 3 mm calculus in the mid right kidney, nonobstructing. There is a calculus just beyond the right ureteropelvic junction measuring 4 x 3 mm. No more distal ureteral calculi are appreciable. There is no renal or ureteral calculus on the left. Urinary bladder is midline with wall thickness within normal limits. Note that there are multiple pelvic phleboliths near but felt to be separate from the distal ureters on both sides. Stomach/Bowel: The patient is status post gastric bypass procedure with postoperative to areas patent. No wall thickening or fluid noted in the areas of previous gastric bypass surgery. There are multiple sigmoid diverticula without diverticulitis. There is no appreciable bowel wall or mesenteric thickening. There is no evident bowel obstruction. No free air or portal venous air is evident.  Vascular/Lymphatic: There is atherosclerotic calcification in the aorta and common iliac arteries. Calcification is also noted in each hypogastric artery. Major mesenteric vessels appear patent on this noncontrast enhanced study. No adenopathy is evident in the abdomen or pelvis. Reproductive: Uterus is absent.  No pelvic mass. Other: There has been previous repair of ventral hernia with scarring in the periumbilical region. Appendix is absent. There is no abscess or ascites in the abdomen or pelvis. Musculoskeletal: There is degenerative change in the lower thoracic and lumbar spine regions. There are no blastic or lytic bone lesions. No intramuscular or abdominal wall lesions. IMPRESSION: Hydronephrosis on the right with 4 x 3 mm calculus just distal to the ureteropelvic junction on the right. There is also a 5 x 3 mm calculus, nonobstructing, in the mid right kidney. Status post gastric bypass grafting without complicating feature. Sigmoid diverticula without diverticulitis. No bowel obstruction. No abscess. Uterus, gallbladder, and appendix absent. Previous repair of ventral hernia with scarring currently in the periumbilical region. Hiatal hernia noted. There is aortoiliac atherosclerosis. Aortic Atherosclerosis (ICD10-I70.0). Electronically Signed   By: Bretta Bang III M.D.   On: 12/18/2016 17:54    Procedures Procedures (including critical care time)  Medications Ordered in ED Medications  HYDROmorphone (DILAUDID) injection 1 mg (1 mg Intramuscular Given 12/18/16 1725)     Initial Impression / Assessment and Plan / ED Course  I have reviewed the triage vital signs and the nursing notes.  Pertinent labs & imaging results that were available during my care of the patient were reviewed by me and considered in my medical decision making (see chart for details).    Patient has a kidney stone. She will be treated with Percocet Zofran and Flomax and follow-up with [facility]  Final  Clinical Impressions(s) / ED Diagnoses   Final diagnoses:  Pain  Kidney stone    New Prescriptions New Prescriptions   ONDANSETRON (ZOFRAN ODT) 4 MG DISINTEGRATING TABLET    4mg  ODT q4 hours prn nausea/vomit   OXYCODONE-ACETAMINOPHEN (PERCOCET/ROXICET) 5-325 MG TABLET    Take 1 tablet by mouth every 6 (six) hours as needed.   TAMSULOSIN (FLOMAX) 0.4 MG CAPS CAPSULE    Take 1 capsule (0.4 mg total) by mouth daily.     Bethann Berkshire, MD 12/18/16 Zollie Pee

## 2016-12-18 NOTE — ED Triage Notes (Signed)
PT c/o right flank pain with no urinary symptoms noted about an hour ago with some nausea. PT states hx of kidney stones.

## 2016-12-18 NOTE — ED Notes (Signed)
Pt ambulatory to the bathroom 

## 2016-12-18 NOTE — Discharge Instructions (Signed)
Follow-up with [facility] this week °

## 2016-12-19 NOTE — Telephone Encounter (Signed)
I spoke to pt and she said PCP should be checking and sending me last reports from old system. She will call and let them and remind them.

## 2017-11-08 ENCOUNTER — Encounter: Payer: Self-pay | Admitting: Internal Medicine

## 2017-11-13 ENCOUNTER — Other Ambulatory Visit: Payer: Self-pay | Admitting: "Endocrinology

## 2017-11-14 LAB — COMPLETE METABOLIC PANEL WITH GFR
AG RATIO: 1.4 (calc) (ref 1.0–2.5)
ALT: 16 U/L (ref 6–29)
AST: 17 U/L (ref 10–35)
Albumin: 4.4 g/dL (ref 3.6–5.1)
Alkaline phosphatase (APISO): 109 U/L (ref 33–130)
BILIRUBIN TOTAL: 0.6 mg/dL (ref 0.2–1.2)
BUN: 12 mg/dL (ref 7–25)
CALCIUM: 9.5 mg/dL (ref 8.6–10.4)
CO2: 30 mmol/L (ref 20–32)
Chloride: 104 mmol/L (ref 98–110)
Creat: 0.97 mg/dL (ref 0.50–0.99)
GFR, Est African American: 72 mL/min/{1.73_m2} (ref >=60)
GFR, Est Non African American: 62 mL/min/{1.73_m2} (ref >=60)
Globulin: 3.1 g/dL (calc) (ref 1.9–3.7)
Glucose, Bld: 82 mg/dL (ref 65–99)
POTASSIUM: 4.6 mmol/L (ref 3.5–5.3)
Sodium: 139 mmol/L (ref 135–146)
Total Protein: 7.5 g/dL (ref 6.1–8.1)

## 2017-11-14 LAB — HEMOGLOBIN A1C
EAG (MMOL/L): 7.9 (calc)
HEMOGLOBIN A1C: 6.6 %{Hb} — AB (ref <5.7)
MEAN PLASMA GLUCOSE: 143 (calc)

## 2017-11-15 IMAGING — CT CT ABD-PELV W/ CM
2 of 5 series · 16 of 46 positions shown, 18 images · IV contrast (iopamidol)
Comparison: 07/08/2014 unenhanced CT of the abdomen and pelvis.

CLINICAL DATA: Abdominal pain with single episode of vomiting.

EXAM:
CT ABDOMEN AND PELVIS WITH CONTRAST
TECHNIQUE: Multidetector CT imaging of the abdomen and pelvis was performed
using the standard protocol following bolus administration of
intravenous contrast.
CONTRAST:  100mL [id number] IOPAMIDOL ([id number]) INJECTION 61%

[Series 2: routine abd pel with · axial · 0.84mm/px · z∈[-426,-36]mm · 13 of 90 slices shown, 15 images]
[im 6/90  soft-tissue]
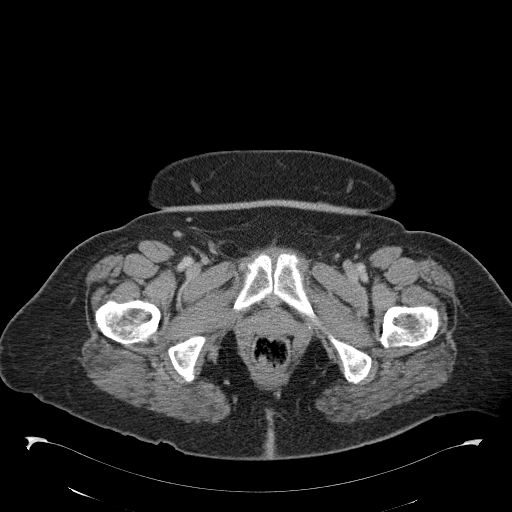
[im 6/90  bone]
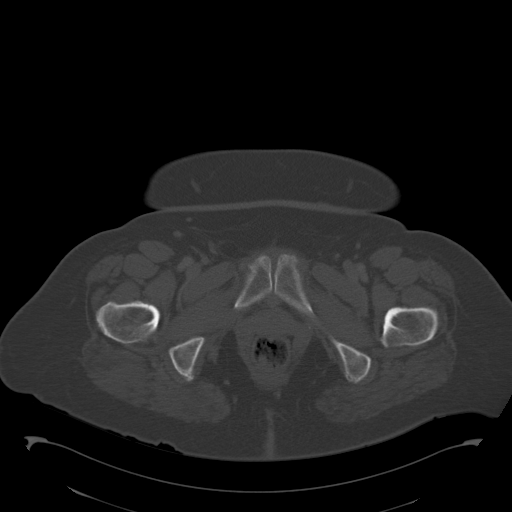
[im 11/90  soft-tissue]
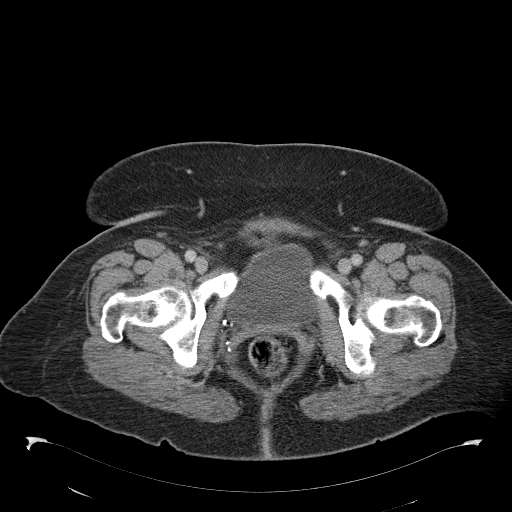
[im 21/90  soft-tissue]
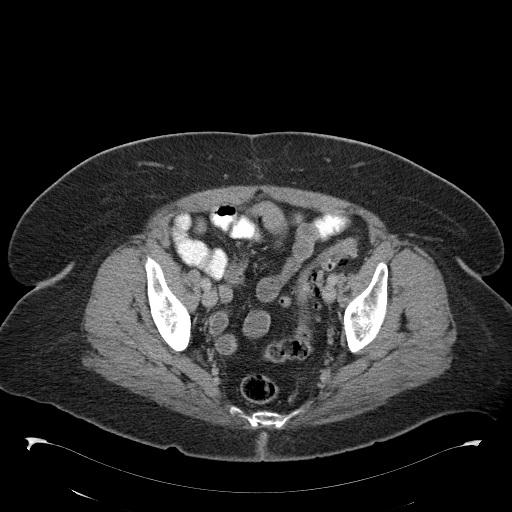
[im 27/90  soft-tissue]
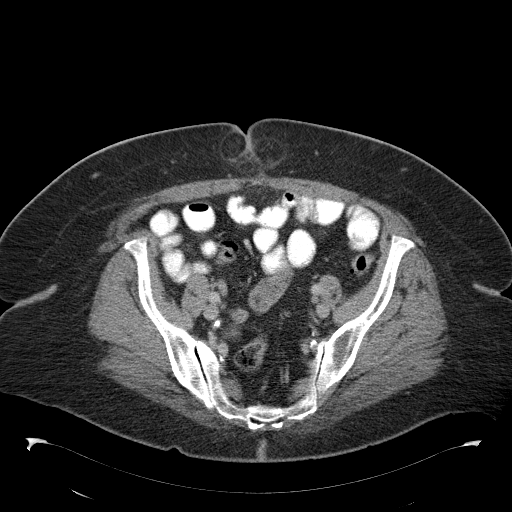
[im 32/90  soft-tissue]
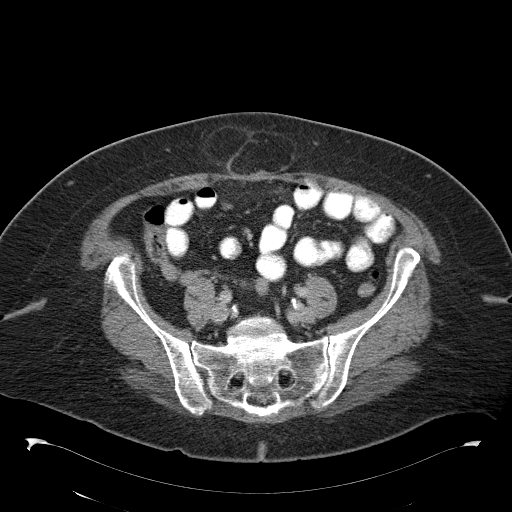
[im 37/90  soft-tissue]
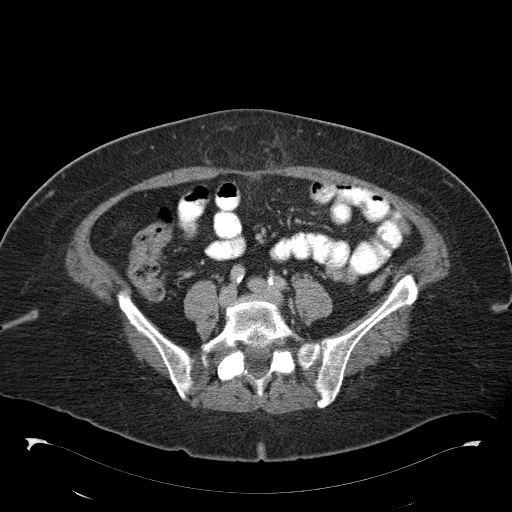
[im 48/90  soft-tissue]
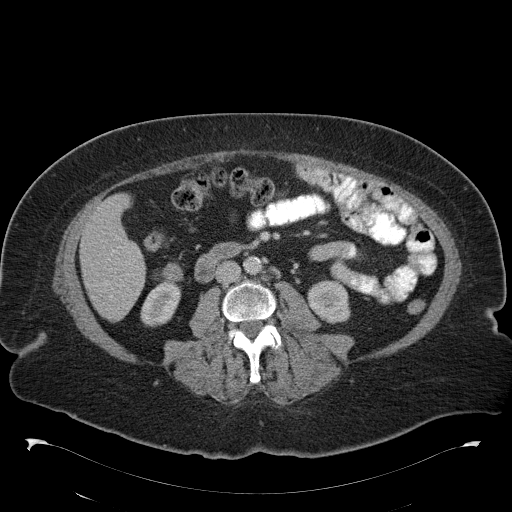
[im 53/90  soft-tissue]
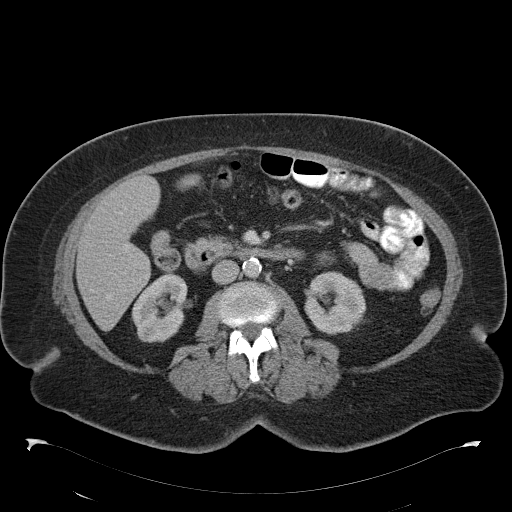
[im 58/90  soft-tissue]
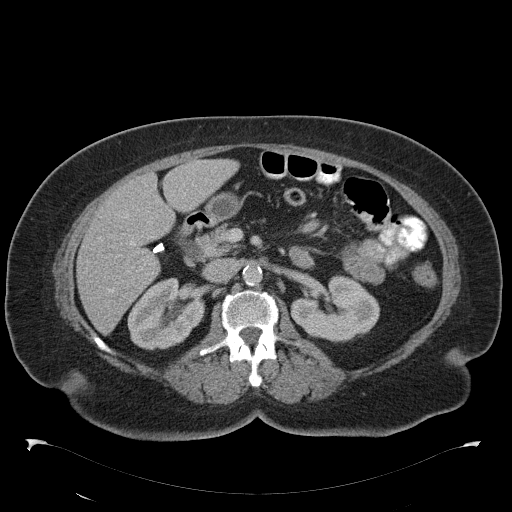
[im 58/90  bone]
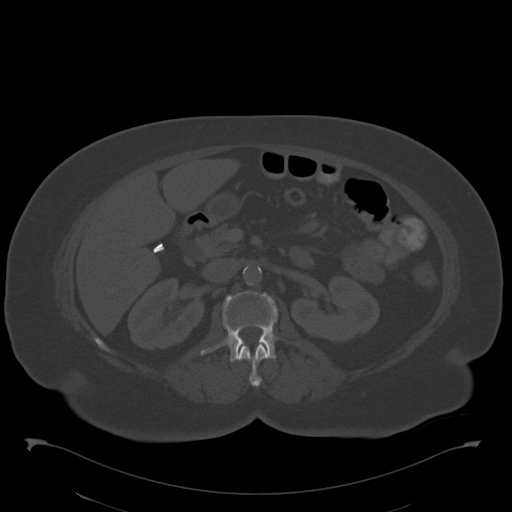
[im 63/90  soft-tissue]
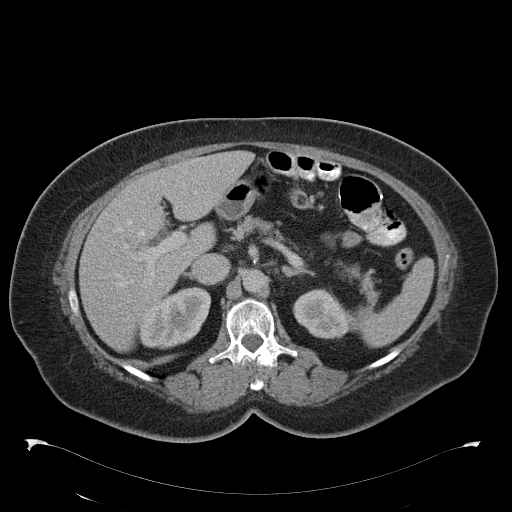
[im 69/90  soft-tissue]
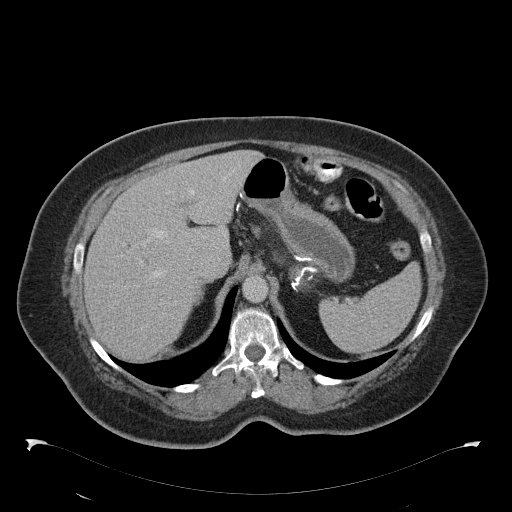
[im 79/90  soft-tissue]
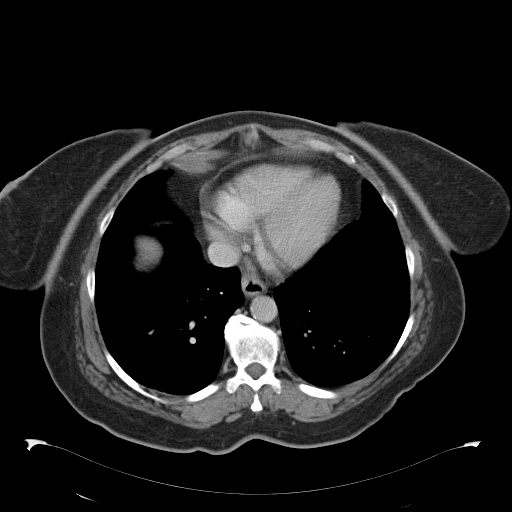
[im 84/90  soft-tissue]
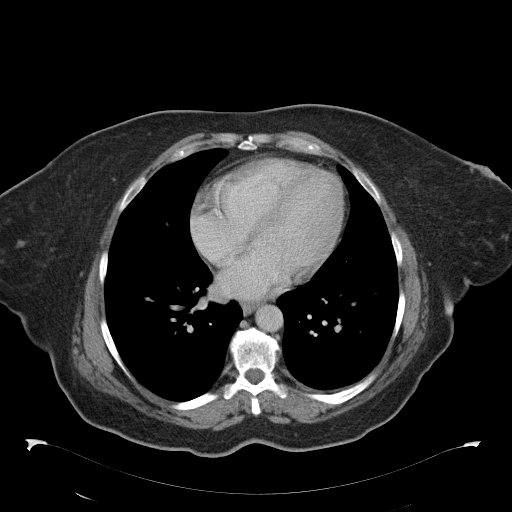

[Series 4: coronal · coronal · 0.80mm/px · 3 of 138 slices shown]
[im 46/138  soft-tissue]
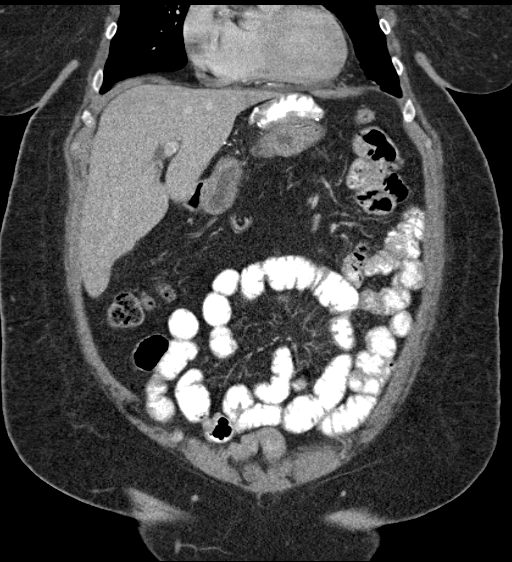
[im 61/138  soft-tissue]
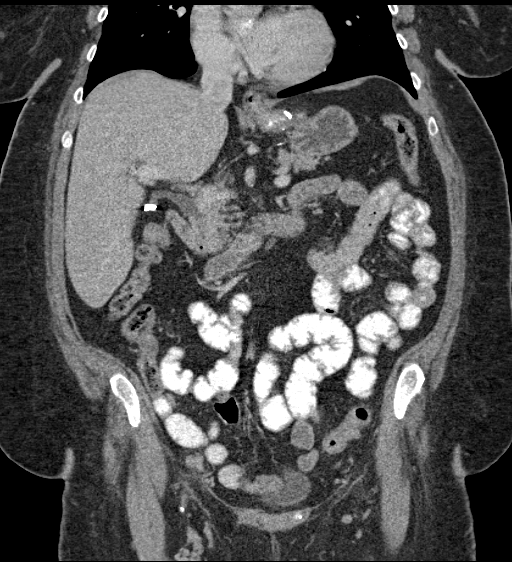
[im 77/138  soft-tissue]
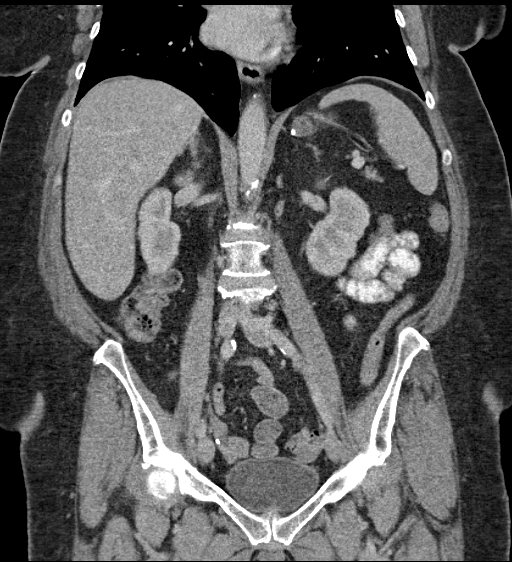

[16 of 46 positions shown; findings below may reference images not displayed]

FINDINGS: Lower chest:  No acute findings.

Hepatobiliary: The liver is unremarkable. The gallbladder has been
removed. The common bile duct measures up to 12 mm in diameter. This
is likely related to prior cholecystectomy. No calculi identified in
the bile ducts.

Pancreas: No mass, inflammatory changes, or other significant
abnormality.

Spleen: Within normal limits in size and appearance.

Adrenals/Urinary Tract: No masses identified. No evidence of
hydronephrosis. No urinary tract calculi identified. The bladder is
unremarkable.

Stomach/Bowel: Evidence of Roux-en-Y gastric bypass with no evidence
of surgical complication or bowel obstruction. No free air or
abscess is identified. No abnormal small bowel loops. Sigmoid
diverticulosis noted without evidence of acute diverticulitis.

Vascular/Lymphatic: No pathologically enlarged lymph nodes. No
evidence of abdominal aortic aneurysm.

Reproductive: Evidence of hysterectomy.

Other: Ventral hernia defect in the midline abdominal wall is
present just superior to the umbilicus. This hernia defect contains
fat and no bowel or fluid.

Musculoskeletal: Mild proliferative changes of the lumbar spine. No
focal bony lesions or evidence of fracture.
IMPRESSION: No acute findings in the abdomen or pelvis. Evidence of Roux-en-Y
gastric bypass without obstruction or perforation. Ventral hernia
just superior to the umbilicus contains fat.

## 2017-11-29 ENCOUNTER — Ambulatory Visit: Payer: Medicare Other | Admitting: "Endocrinology

## 2017-12-19 ENCOUNTER — Ambulatory Visit: Payer: Medicare Other

## 2017-12-19 ENCOUNTER — Encounter: Payer: Self-pay | Admitting: *Deleted

## 2017-12-19 ENCOUNTER — Telehealth: Payer: Self-pay | Admitting: *Deleted

## 2017-12-19 NOTE — Telephone Encounter (Signed)
noted 

## 2017-12-19 NOTE — Telephone Encounter (Signed)
PATIENT WAS A NO SHOW AND LETTER SENT  °

## 2017-12-25 ENCOUNTER — Ambulatory Visit: Payer: Medicare Other | Admitting: "Endocrinology

## 2018-01-23 ENCOUNTER — Encounter: Payer: Self-pay | Admitting: "Endocrinology

## 2018-01-23 ENCOUNTER — Ambulatory Visit (INDEPENDENT_AMBULATORY_CARE_PROVIDER_SITE_OTHER): Payer: Medicare Other | Admitting: "Endocrinology

## 2018-01-23 VITALS — BP 137/72 | HR 64 | Ht 63.0 in | Wt 174.0 lb

## 2018-01-23 DIAGNOSIS — E782 Mixed hyperlipidemia: Secondary | ICD-10-CM

## 2018-01-23 DIAGNOSIS — I10 Essential (primary) hypertension: Secondary | ICD-10-CM | POA: Insufficient documentation

## 2018-01-23 DIAGNOSIS — E1165 Type 2 diabetes mellitus with hyperglycemia: Secondary | ICD-10-CM

## 2018-01-23 MED ORDER — METFORMIN HCL ER 500 MG PO TB24: 500.0000 mg | ORAL_TABLET | Freq: Every day | ORAL | 3 refills | Status: DC

## 2018-01-23 NOTE — Progress Notes (Signed)
Endocrinology follow-up note  Subjective:    Patient ID: Allison Lara, female    DOB: 14-May-1951,    Past Medical History:  Diagnosis Date  . Diabetes mellitus   . Hiatal hernia   . Hypertension   . Renal disorder    kidney stones  . Sleep apnea    Past Surgical History:  Procedure Laterality Date  . ABDOMINAL HYSTERECTOMY    . APPENDECTOMY    . BREAST SURGERY    . CHOLECYSTECTOMY    . GASTRIC BYPASS    . HERNIA REPAIR    . TONSILLECTOMY    . tubal ligation     Social History   Socioeconomic History  . Marital status: Legally Separated    Spouse name: Not on file  . Number of children: Not on file  . Years of education: Not on file  . Highest education level: Not on file  Occupational History  . Not on file  Social Needs  . Financial resource strain: Not on file  . Food insecurity:    Worry: Not on file    Inability: Not on file  . Transportation needs:    Medical: Not on file    Non-medical: Not on file  Tobacco Use  . Smoking status: Never Smoker  . Smokeless tobacco: Never Used  Substance and Sexual Activity  . Alcohol use: No  . Drug use: No  . Sexual activity: Yes    Birth control/protection: Post-menopausal  Lifestyle  . Physical activity:    Days per week: Not on file    Minutes per session: Not on file  . Stress: Not on file  Relationships  . Social connections:    Talks on phone: Not on file    Gets together: Not on file    Attends religious service: Not on file    Active member of club or organization: Not on file    Attends meetings of clubs or organizations: Not on file    Relationship status: Not on file  Other Topics Concern  . Not on file  Social History Narrative  . Not on file   Outpatient Encounter Medications as of 01/23/2018  Medication Sig  . acetaminophen (TYLENOL) 500 MG tablet Take 500 mg by mouth every 6 (six) hours as needed for mild pain.   Marland Kitchen amLODipine (NORVASC) 10 MG tablet Take 10 mg by mouth daily.    . B  Complex-C (B-COMPLEX WITH VITAMIN C) tablet Take 1 tablet by mouth daily.  . colchicine 0.6 MG tablet Take 0.6 mg by mouth daily.  . Insulin Glargine (LANTUS SOLOSTAR) 100 UNIT/ML Solostar Pen Inject 20 Units into the skin at bedtime.  Marland Kitchen labetalol (NORMODYNE) 200 MG tablet Take 1 tablet (200 mg total) by mouth 2 (two) times daily. (Patient taking differently: Take 200 mg by mouth daily. )  . metFORMIN (GLUCOPHAGE XR) 500 MG 24 hr tablet Take 1 tablet (500 mg total) by mouth daily with breakfast.  . Multiple Vitamins-Minerals (MULTIVITAMIN ADULT PO) Take 3 tablets by mouth daily.   . ondansetron (ZOFRAN ODT) 4 MG disintegrating tablet 4mg  ODT q4 hours prn nausea/vomit  . oxyCODONE-acetaminophen (PERCOCET/ROXICET) 5-325 MG tablet Take 1 tablet by mouth every 6 (six) hours as needed.  . rosuvastatin (CRESTOR) 10 MG tablet Take 10 mg by mouth daily.   . tamsulosin (FLOMAX) 0.4 MG CAPS capsule Take 1 capsule (0.4 mg total) by mouth daily.  . valsartan (DIOVAN) 160 MG tablet Take 160 mg by mouth every evening.  No facility-administered encounter medications on file as of 01/23/2018.    ALLERGIES: Allergies  Allergen Reactions  . Aspirin   . Claritin [Loratadine]   . Codeine Nausea And Vomiting  . Ketorolac Tromethamine Other (See Comments)    Burns my stomach  . Morphine And Related Other (See Comments)    Tachycardia   VACCINATION STATUS:  There is no immunization history on file for this patient.  HPI  The patient presents with a medical history as above.  She was seen in consultation for uncontrolled type 2 diabetes until October 2016 when she was kept on Lantus 50 units nightly and Glucophage 500 mg p.o. daily.    -She failed to return for follow-up visit for unclear reasons. -She was diagnosed with type 2 diabetes at approximate age of 8 years. She underwent bariatric surgery on 01/18/2015, which helped her lose approximately 80 pounds of weight over time.   -She denies  hypoglycemia.  She did not bring any meter nor logs to review today. She is also being treated for hypertension, hyperlipidemia and has been overweight to obese most of their adult life. Patient  she has history of stage 1-2 CK D which has since reversed to normal, denies history of CAD, CVA, Neuropathy, and Retinopathy. Patient has  a family history of type 2 DM in her sister, parents, and grandparents. Patient is a non- smoker, does not  participate in a regular exercise program. Patient is willing to engage in intensive monitoring and therapy along with change in life style.   Review of Systems  Constitutional: Negative for fatigue and unexpected weight change.       She underwent bariatric surgery on 01/18/2015. She lost approximately 80 pounds subsequently.  HENT: Negative for trouble swallowing and voice change.   Eyes: Negative for visual disturbance.  Respiratory: Negative for cough, shortness of breath and wheezing.   Cardiovascular: Negative for chest pain, palpitations and leg swelling.  Gastrointestinal: Negative for diarrhea, nausea and vomiting.  Endocrine: Negative for cold intolerance, heat intolerance, polydipsia, polyphagia and polyuria.  Musculoskeletal: Negative for arthralgias and myalgias.  Skin: Negative for color change, pallor, rash and wound.  Neurological: Negative for seizures and headaches.  Psychiatric/Behavioral: Negative for confusion and suicidal ideas.    Objective:    BP 137/72   Pulse 64   Ht 5\' 3"  (1.6 m)   Wt 174 lb (78.9 kg)   BMI 30.82 kg/m   Wt Readings from Last 3 Encounters:  01/23/18 174 lb (78.9 kg)  12/18/16 178 lb (80.7 kg)  12/25/15 192 lb (87.1 kg)    Physical Exam  Constitutional: She is oriented to person, place, and time. She appears well-developed.  HENT:  Head: Normocephalic and atraumatic.  Eyes: EOM are normal.  Neck: Normal range of motion. Neck supple. No tracheal deviation present. No thyromegaly present.   Cardiovascular: Normal rate.  Pulmonary/Chest: Effort normal.  Abdominal: Soft. There is no tenderness. There is no guarding.  Musculoskeletal: Normal range of motion. She exhibits no edema.  Neurological: She is alert and oriented to person, place, and time. No cranial nerve deficit. Coordination normal.  Skin: Skin is warm and dry. No rash noted. No erythema. No pallor.  Psychiatric: She has a normal mood and affect. Judgment normal.    Results for orders placed or performed in visit on 11/13/17  COMPLETE METABOLIC PANEL WITH GFR  Result Value Ref Range   Glucose, Bld 82 65 - 99 mg/dL   BUN 12 7 - 25  mg/dL   Creat 4.09 8.11 - 9.14 mg/dL   GFR, Est Non African American 62 > OR = 60 mL/min/1.48m2   GFR, Est African American 72 > OR = 60 mL/min/1.82m2   BUN/Creatinine Ratio NOT APPLICABLE 6 - 22 (calc)   Sodium 139 135 - 146 mmol/L   Potassium 4.6 3.5 - 5.3 mmol/L   Chloride 104 98 - 110 mmol/L   CO2 30 20 - 32 mmol/L   Calcium 9.5 8.6 - 10.4 mg/dL   Total Protein 7.5 6.1 - 8.1 g/dL   Albumin 4.4 3.6 - 5.1 g/dL   Globulin 3.1 1.9 - 3.7 g/dL (calc)   AG Ratio 1.4 1.0 - 2.5 (calc)   Total Bilirubin 0.6 0.2 - 1.2 mg/dL   Alkaline phosphatase (APISO) 109 33 - 130 U/L   AST 17 10 - 35 U/L   ALT 16 6 - 29 U/L  Hemoglobin A1c  Result Value Ref Range   Hgb A1c MFr Bld 6.6 (H) <5.7 % of total Hgb   Mean Plasma Glucose 143 (calc)   eAG (mmol/L) 7.9 (calc)   Complete Blood Count (Most recent): Lab Results  Component Value Date   WBC 8.3 12/18/2016   HGB 13.3 12/18/2016   HCT 40.3 12/18/2016   MCV 88.2 12/18/2016   PLT 274 12/18/2016   Chemistry (most recent): Lab Results  Component Value Date   NA 139 11/13/2017   K 4.6 11/13/2017   CL 104 11/13/2017   CO2 30 11/13/2017   BUN 12 11/13/2017   CREATININE 0.97 11/13/2017   Diabetic Labs (most recent): Lab Results  Component Value Date   HGBA1C 6.6 (H) 11/13/2017   HGBA1C 7.6 (A) 04/15/2015   HGBA1C 7.0 (A) 11/05/2014       Assessment & Plan:   1. Type 2 diabetes mellitus with stage 1 chronic kidney disease, without long-term current use of insulin (HCC  -After long absence from clinic she returns with controlled diabetes with A1c of 6.6%, improving from 7.6%.   -This is as a result of her significant weight loss of 80 pounds following her bariatric surgery in 2016.    -Patient remains at a high risk for more acute and chronic complications of diabetes which include CAD, CVA, CKD, retinopathy, and neuropathy. These are all discussed in detail with the patient.  - I have counseled the patient on diet management and weight loss, by adopting a carbohydrate restricted/protein rich diet.  - Patient is advised to stick to a routine mealtimes to eat 3 meals  a day and necessary snacks to avoid hypoglycemia.  -  Suggestion is made for her to avoid simple carbohydrates  from her diet including Cakes, Sweet Desserts / Pastries, Ice Cream, Soda (diet and regular), Sweet Tea, Candies, Chips, Cookies, Store Bought Juices, Alcohol in Excess of  1-2 drinks a day, Artificial Sweeteners, and "Sugar-free" Products. This will help patient to have stable blood glucose profile and potentially avoid unintended weight gain.   - I encouraged the patient to switch to  unprocessed or minimally processed complex starch and increased protein intake (animal or plant source), fruits, and vegetables.   - I have approached patient with the following individualized plan to manage diabetes and patient agrees:    -In light of her significant weight loss of 80 pounds following her bariatric surgery, she will be considered for a lower dose of insulin.   -I advised her to lower her Lantus to 20 units nightly associated with monitoring of blood  glucose daily before breakfast and at any other time as needed.    -She has no contraindication for metformin treatment.  I discussed and reinitiated metformin 500 mg p.o. daily after breakfast.     -Patient is encouraged to call clinic for blood glucose levels less than 70 or above 300 mg /dl.  - Patient will be considered for incretin therapy as appropriate next visit. - Patient specific target  A1c;  LDL, HDL, Triglycerides, and  Waist Circumference were discussed in detail.  2) BP/HTN: Her blood pressure is controlled to target.  She will continue on amlodipine 10 mg p.o. daily, labetalol 200 mg p.o. twice daily, and valsartan 160 mg p.o. daily.   3) Lipids/HPL: No recent lipid panel to review.  She is advised to continue Crestor 10 mg p.o. nightly.    4)  Obesity: She is status post bariatric surgery, lost 80 pounds so far. I encouraged her to stay engaged in the dietary recommendations, exercise, and carbohydrates information provided.  5) Chronic Care/Health Maintenance:  -Patient ARB and Statin medications and encouraged to continue to follow up with Ophthalmology, Podiatrist at least yearly or according to recommendations, and advised to  stay away from smoking. I have recommended yearly flu vaccine and pneumonia vaccination at least every 5 years; moderate intensity exercise for up to 150 minutes weekly; and  sleep for at least 7 hours a day.   I advised patient to maintain close follow up with their PCP for primary care needs.  - Time spent with the patient: 25 min, of which >50% was spent in reviewing her blood glucose logs , discussing her hypo- and hyper-glycemic episodes, reviewing her current and  previous labs and insulin doses and developing a plan to avoid hypo- and hyper-glycemia. Please refer to Patient Instructions for Blood Glucose Monitoring and Insulin/Medications Dosing Guide"  in media tab for additional information. Allison Legatoarrell T Jaycox participated in the discussions, expressed understanding, and voiced agreement with the above plans.  All questions were answered to her satisfaction. she is encouraged to contact clinic should she have any questions or concerns  prior to her return visit.  Follow up plan: Return in about 4 months (around 05/26/2018) for meter, and logs.  Marquis LunchGebre Hensley Treat, MD Phone: [phone]  Fax: [phone]  -  This note was partially dictated with voice recognition software. Similar sounding words can be transcribed inadequately or may not  be corrected upon review.  01/23/2018, 5:35 PM

## 2018-01-23 NOTE — Patient Instructions (Signed)

## 2018-05-28 ENCOUNTER — Ambulatory Visit: Payer: Medicare Other | Admitting: "Endocrinology

## 2019-01-29 ENCOUNTER — Telehealth: Payer: Self-pay | Admitting: "Endocrinology

## 2019-01-29 DIAGNOSIS — E1165 Type 2 diabetes mellitus with hyperglycemia: Secondary | ICD-10-CM

## 2019-01-29 NOTE — Telephone Encounter (Signed)
Patient needs updated lab order °

## 2019-01-30 NOTE — Telephone Encounter (Signed)
Order placed

## 2019-02-12 ENCOUNTER — Ambulatory Visit: Payer: Medicare Other | Admitting: "Endocrinology

## 2019-02-14 ENCOUNTER — Ambulatory Visit: Payer: Medicare Other | Admitting: "Endocrinology

## 2019-02-25 ENCOUNTER — Ambulatory Visit: Payer: Medicare Other | Admitting: "Endocrinology

## 2019-03-06 LAB — COMPREHENSIVE METABOLIC PANEL
AG Ratio: 1.8 (calc) (ref 1.0–2.5)
ALT: 26 U/L (ref 6–29)
AST: 22 U/L (ref 10–35)
Albumin: 4.4 g/dL (ref 3.6–5.1)
Alkaline phosphatase (APISO): 92 U/L (ref 37–153)
BUN: 16 mg/dL (ref 7–25)
CO2: 27 mmol/L (ref 20–32)
Calcium: 9.3 mg/dL (ref 8.6–10.4)
Chloride: 104 mmol/L (ref 98–110)
Creat: 0.86 mg/dL (ref 0.50–0.99)
Globulin: 2.5 g/dL (calc) (ref 1.9–3.7)
Glucose, Bld: 86 mg/dL (ref 65–139)
Potassium: 4.1 mmol/L (ref 3.5–5.3)
Sodium: 139 mmol/L (ref 135–146)
Total Bilirubin: 0.5 mg/dL (ref 0.2–1.2)
Total Protein: 6.9 g/dL (ref 6.1–8.1)

## 2019-03-06 LAB — HEMOGLOBIN A1C
Hgb A1c MFr Bld: 7.2 % of total Hgb — ABNORMAL HIGH (ref <5.7)
Mean Plasma Glucose: 160 (calc)
eAG (mmol/L): 8.9 (calc)

## 2019-03-20 ENCOUNTER — Encounter (INDEPENDENT_AMBULATORY_CARE_PROVIDER_SITE_OTHER): Payer: Self-pay

## 2019-03-20 ENCOUNTER — Ambulatory Visit (INDEPENDENT_AMBULATORY_CARE_PROVIDER_SITE_OTHER): Payer: Medicare Other | Admitting: "Endocrinology

## 2019-03-20 ENCOUNTER — Encounter: Payer: Self-pay | Admitting: "Endocrinology

## 2019-03-20 ENCOUNTER — Other Ambulatory Visit: Payer: Self-pay

## 2019-03-20 DIAGNOSIS — E782 Mixed hyperlipidemia: Secondary | ICD-10-CM | POA: Diagnosis not present

## 2019-03-20 DIAGNOSIS — E1165 Type 2 diabetes mellitus with hyperglycemia: Secondary | ICD-10-CM | POA: Diagnosis not present

## 2019-03-20 DIAGNOSIS — I10 Essential (primary) hypertension: Secondary | ICD-10-CM

## 2019-03-20 MED ORDER — METFORMIN HCL ER 500 MG PO TB24: 500.0000 mg | ORAL_TABLET | Freq: Every day | ORAL | 3 refills | Status: DC

## 2019-03-20 NOTE — Progress Notes (Signed)
03/20/2019                                                    Endocrinology Telehealth Visit Follow up Note -During COVID -19 Pandemic  This visit type was conducted due to national recommendations for restrictions regarding the COVID-19 Pandemic  in an effort to limit this patient's exposure and mitigate transmission of the corona virus.  Due to her co-morbid illnesses, Allison LegatoDarrell T Maroney is at  moderate to high risk for complications without adequate follow up.  This format is felt to be most appropriate for her at this time.  I connected with this patient on 03/20/2019   by telephone and verified that I am speaking with the correct person using two identifiers. Allison Lara, [birth year]. she has verbally consented to this visit. All issues noted in this document were discussed and addressed. The format was not optimal for physical exam.    Subjective:    Patient ID: Allison Lara, female    DOB: [birth year],    Past Medical History:  Diagnosis Date  . Diabetes mellitus   . Hiatal hernia   . Hypertension   . Renal disorder    kidney stones  . Sleep apnea    Past Surgical History:  Procedure Laterality Date  . ABDOMINAL HYSTERECTOMY    . APPENDECTOMY    . BREAST SURGERY    . CHOLECYSTECTOMY    . GASTRIC BYPASS    . HERNIA REPAIR    . TONSILLECTOMY    . tubal ligation     Social History   Socioeconomic History  . Marital status: Legally Separated    Spouse name: Not on file  . Number of children: Not on file  . Years of education: Not on file  . Highest education level: Not on file  Occupational History  . Not on file  Social Needs  . Financial resource strain: Not on file  . Food insecurity    Worry: Not on file    Inability: Not on file  . Transportation needs    Medical: Not on file    Non-medical: Not on file  Tobacco Use  . Smoking status: Never Smoker  . Smokeless tobacco: Never Used  Substance and Sexual Activity  . Alcohol use: No  . Drug use: No  .  Sexual activity: Yes    Birth control/protection: Post-menopausal  Lifestyle  . Physical activity    Days per week: Not on file    Minutes per session: Not on file  . Stress: Not on file  Relationships  . Social Musicianconnections    Talks on phone: Not on file    Gets together: Not on file    Attends religious service: Not on file    Active member of club or organization: Not on file    Attends meetings of clubs or organizations: Not on file    Relationship status: Not on file  Other Topics Concern  . Not on file  Social History Narrative  . Not on file   Outpatient Encounter Medications as of 03/20/2019  Medication Sig  . acetaminophen (TYLENOL) 500 MG tablet Take 500 mg by mouth every 6 (six) hours as needed for mild pain.   Marland Kitchen. amLODipine (NORVASC) 10 MG tablet Take 10 mg by mouth daily.    . B Complex-C (  B-COMPLEX WITH VITAMIN C) tablet Take 1 tablet by mouth daily.  . colchicine 0.6 MG tablet Take 0.6 mg by mouth daily.  . Insulin Glargine (LANTUS SOLOSTAR) 100 UNIT/ML Solostar Pen Inject 30 Units into the skin at bedtime.  Marland Kitchen labetalol (NORMODYNE) 200 MG tablet Take 1 tablet (200 mg total) by mouth 2 (two) times daily. (Patient taking differently: Take 200 mg by mouth daily. )  . metFORMIN (GLUCOPHAGE XR) 500 MG 24 hr tablet Take 1 tablet (500 mg total) by mouth daily with breakfast.  . Multiple Vitamins-Minerals (MULTIVITAMIN ADULT PO) Take 3 tablets by mouth daily.   . ondansetron (ZOFRAN ODT) 4 MG disintegrating tablet 4mg  ODT q4 hours prn nausea/vomit  . oxyCODONE-acetaminophen (PERCOCET/ROXICET) 5-325 MG tablet Take 1 tablet by mouth every 6 (six) hours as needed.  . rosuvastatin (CRESTOR) 10 MG tablet Take 10 mg by mouth daily.   . tamsulosin (FLOMAX) 0.4 MG CAPS capsule Take 1 capsule (0.4 mg total) by mouth daily.  . valsartan (DIOVAN) 160 MG tablet Take 160 mg by mouth every evening.   . [DISCONTINUED] metFORMIN (GLUCOPHAGE XR) 500 MG 24 hr tablet Take 1 tablet (500 mg total)  by mouth daily with breakfast.   No facility-administered encounter medications on file as of 03/20/2019.    ALLERGIES: Allergies  Allergen Reactions  . Aspirin   . Claritin [Loratadine]   . Codeine Nausea And Vomiting  . Ketorolac Tromethamine Other (See Comments)    Burns my stomach  . Morphine And Related Other (See Comments)    Tachycardia   VACCINATION STATUS:  There is no immunization history on file for this patient.  HPI  She was diagnosed with type 2 diabetes at approximately age of 72 years.  She was last seen in July 2019 with A1c of 6.6%.  She missed her last appointment.  -She is being engaged in telehealth via telephone for follow-up for her type 2 diabetes, hyperlipidemia, hypertension.  -She failed to return for follow-up visit for unclear reasons. -She was diagnosed with type 2 diabetes at approximate age of 66 years. She underwent bariatric surgery on 01/18/2015, which helped her lose approximately 80 pounds of weight over time.   -She denies hypoglycemia.  She is not monitoring blood glucose regularly, but A1c 7.2% increasing from 6.6 percent.   She is also being treated for hypertension, hyperlipidemia and has been overweight to obese most of their adult life. Patient  she has history of stage 1-2 CK D which has since reversed to normal, denies history of CAD, CVA, Neuropathy, and Retinopathy. Patient has  a family history of type 2 DM in her sister, parents, and grandparents. Patient is a non- smoker, does not  participate in a regular exercise program. Patient is willing to engage in intensive monitoring and therapy along with change in life style.   Review of systems: Limited as above  Objective:    There were no vitals taken for this visit.  Wt Readings from Last 3 Encounters:  01/23/18 174 lb (78.9 kg)  12/18/16 178 lb (80.7 kg)  12/25/15 192 lb (87.1 kg)      Results for orders placed or performed in visit on 01/29/19  Comprehensive metabolic  panel  Result Value Ref Range   Glucose, Bld 86 65 - 139 mg/dL   BUN 16 7 - 25 mg/dL   Creat 7.84 7.84 - 1.28 mg/dL   BUN/Creatinine Ratio NOT APPLICABLE 6 - 22 (calc)   Sodium 139 135 - 146 mmol/L  Potassium 4.1 3.5 - 5.3 mmol/L   Chloride 104 98 - 110 mmol/L   CO2 27 20 - 32 mmol/L   Calcium 9.3 8.6 - 10.4 mg/dL   Total Protein 6.9 6.1 - 8.1 g/dL   Albumin 4.4 3.6 - 5.1 g/dL   Globulin 2.5 1.9 - 3.7 g/dL (calc)   AG Ratio 1.8 1.0 - 2.5 (calc)   Total Bilirubin 0.5 0.2 - 1.2 mg/dL   Alkaline phosphatase (APISO) 92 37 - 153 U/L   AST 22 10 - 35 U/L   ALT 26 6 - 29 U/L  Hemoglobin A1c  Result Value Ref Range   Hgb A1c MFr Bld 7.2 (H) <5.7 % of total Hgb   Mean Plasma Glucose 160 (calc)   eAG (mmol/L) 8.9 (calc)   Complete Blood Count (Most recent): Lab Results  Component Value Date   WBC 8.3 12/18/2016   HGB 13.3 12/18/2016   HCT 40.3 12/18/2016   MCV 88.2 12/18/2016   PLT 274 12/18/2016   Chemistry (most recent): Lab Results  Component Value Date   NA 139 03/05/2019   K 4.1 03/05/2019   CL 104 03/05/2019   CO2 27 03/05/2019   BUN 16 03/05/2019   CREATININE 0.86 03/05/2019   Diabetic Labs (most recent): Lab Results  Component Value Date   HGBA1C 7.2 (H) 03/05/2019   HGBA1C 6.6 (H) 11/13/2017   HGBA1C 7.6 (A) 04/15/2015      Assessment & Plan:   1. Type 2 diabetes mellitus with stage 1 chronic kidney disease, without long-term current use of insulin (HCC  -Once again, after long absence from clinic she is reengaged in telehealth with A1c of 7.2% slightly increasing from 6.6%.     -This patient achieved approximate 80 pounds of weight loss after bariatric surgery in 2016.     -Patient remains at a high risk for more acute and chronic complications of diabetes which include CAD, CVA, CKD, retinopathy, and neuropathy. These are all discussed in detail with the patient.  - I have counseled the patient on diet management and weight loss, by adopting a  carbohydrate restricted/protein rich diet.  - Patient is advised to stick to a routine mealtimes to eat 3 meals  a day and necessary snacks to avoid hypoglycemia.  - she  admits there is a room for improvement in her diet and drink choices. -  Suggestion is made for her to avoid simple carbohydrates  from her diet including Cakes, Sweet Desserts / Pastries, Ice Cream, Soda (diet and regular), Sweet Tea, Candies, Chips, Cookies, Sweet Pastries,  Store Bought Juices, Alcohol in Excess of  1-2 drinks a day, Artificial Sweeteners, Coffee Creamer, and "Sugar-free" Products. This will help patient to have stable blood glucose profile and potentially avoid unintended weight gain.   - I encouraged the patient to switch to  unprocessed or minimally processed complex starch and increased protein intake (animal or plant source), fruits, and vegetables.   - I have approached patient with the following individualized plan to manage diabetes and patient agrees:    -She is advised to continue Lantus 30 units nightly, associated with monitoring of blood glucose twice a day-daily before breakfast and at bedtime. -She has no contraindication for metformin treatment, stopped it for unclear reasons.  However she is willing to retry metformin 500 mg XR p.o. daily after breakfast.   -Patient is encouraged to call clinic for blood glucose levels less than 70 or above 200 mg /dl.  - Patient will  be considered for incretin therapy as appropriate next visit. - Patient specific target  A1c;  LDL, HDL, Triglycerides, and  Waist Circumference were discussed in detail.  2) BP/HTN: she is advised to home monitor blood pressure and report if > 140/90 on 2 separate readings.  She will continue on amlodipine 10 mg p.o. daily, labetalol 200 mg p.o. twice daily, and valsartan 160 mg p.o. daily.   3) Lipids/HPL: No recent lipid panel to review.  She is advised to continue Crestor 10 mg p.o. nightly.    4)  Obesity: She is  status post bariatric surgery, lost 80 pounds so far. I encouraged her to stay engaged in the dietary recommendations, exercise, and carbohydrates information provided.  5) Chronic Care/Health Maintenance:  -Patient ARB and Statin medications and encouraged to continue to follow up with Ophthalmology, Podiatrist at least yearly or according to recommendations, and advised to  stay away from smoking. I have recommended yearly flu vaccine and pneumonia vaccination at least every 5 years; moderate intensity exercise for up to 150 minutes weekly; and  sleep for at least 7 hours a day.   I advised patient to maintain close follow up with their PCP for primary care needs.  - Patient Care Time Today:  25 min, of which >50% was spent in  counseling and the rest reviewing her  current and  previous labs/studies, previous treatments, her blood glucose readings, and medications' doses and developing a plan for long-term care based on the latest recommendations for standards of care.   Rozanna Box participated in the discussions, expressed understanding, and voiced agreement with the above plans.  All questions were answered to her satisfaction. she is encouraged to contact clinic should she have any questions or concerns prior to her return visit.   Follow up plan: Return in about 4 months (around 07/20/2019) for Bring Meter and Logs- A1c in Office, Include 8 log sheets.  Glade Lloyd, MD Phone: [phone]  Fax: [phone]  -  This note was partially dictated with voice recognition software. Similar sounding words can be transcribed inadequately or may not  be corrected upon review.  03/20/2019, 1:45 PM

## 2019-06-18 ENCOUNTER — Other Ambulatory Visit: Payer: Self-pay | Admitting: "Endocrinology

## 2019-07-21 ENCOUNTER — Encounter: Payer: Self-pay | Admitting: "Endocrinology

## 2019-07-21 ENCOUNTER — Other Ambulatory Visit: Payer: Self-pay

## 2019-07-21 ENCOUNTER — Ambulatory Visit (INDEPENDENT_AMBULATORY_CARE_PROVIDER_SITE_OTHER): Payer: Medicare Other | Admitting: "Endocrinology

## 2019-07-21 VITALS — BP 153/76 | HR 71 | Ht 65.0 in | Wt 182.4 lb

## 2019-07-21 DIAGNOSIS — E1165 Type 2 diabetes mellitus with hyperglycemia: Secondary | ICD-10-CM

## 2019-07-21 DIAGNOSIS — E782 Mixed hyperlipidemia: Secondary | ICD-10-CM

## 2019-07-21 DIAGNOSIS — I10 Essential (primary) hypertension: Secondary | ICD-10-CM

## 2019-07-21 MED ORDER — ACCU-CHEK GUIDE VI STRP: ORAL_STRIP | 2 refills | Status: AC

## 2019-07-21 MED ORDER — ACCU-CHEK GUIDE W/DEVICE KIT: 1.0000 | PACK | 0 refills | Status: AC

## 2019-07-21 NOTE — Progress Notes (Signed)
07/21/2019   Endocrinology follow-up note    Subjective:    Patient ID: Allison Lara, female    DOB: 11/27/1950,    Past Medical History:  Diagnosis Date  . Diabetes mellitus   . Hiatal hernia   . Hypertension   . Renal disorder    kidney stones  . Sleep apnea    Past Surgical History:  Procedure Laterality Date  . ABDOMINAL HYSTERECTOMY    . APPENDECTOMY    . BREAST SURGERY    . CHOLECYSTECTOMY    . GASTRIC BYPASS    . HERNIA REPAIR    . TONSILLECTOMY    . tubal ligation     Social History   Socioeconomic History  . Marital status: Legally Separated    Spouse name: Not on file  . Number of children: Not on file  . Years of education: Not on file  . Highest education level: Not on file  Occupational History  . Not on file  Tobacco Use  . Smoking status: Never Smoker  . Smokeless tobacco: Never Used  Substance and Sexual Activity  . Alcohol use: No  . Drug use: No  . Sexual activity: Yes    Birth control/protection: Post-menopausal  Other Topics Concern  . Not on file  Social History Narrative  . Not on file   Social Determinants of Health   Financial Resource Strain:   . Difficulty of Paying Living Expenses: Not on file  Food Insecurity:   . Worried About Charity fundraiser in the Last Year: Not on file  . Ran Out of Food in the Last Year: Not on file  Transportation Needs:   . Lack of Transportation (Medical): Not on file  . Lack of Transportation (Non-Medical): Not on file  Physical Activity:   . Days of Exercise per Week: Not on file  . Minutes of Exercise per Session: Not on file  Stress:   . Feeling of Stress : Not on file  Social Connections:   . Frequency of Communication with Friends and Family: Not on file  . Frequency of Social Gatherings with Friends and Family: Not on file  . Attends Religious Services: Not on file  . Active Member of Clubs or Organizations: Not on file  . Attends Archivist Meetings: Not on file  .  Marital Status: Not on file   Outpatient Encounter Medications as of 07/21/2019  Medication Sig  . acetaminophen (TYLENOL) 500 MG tablet Take 500 mg by mouth every 6 (six) hours as needed for mild pain.   Marland Kitchen amLODipine (NORVASC) 10 MG tablet Take 10 mg by mouth daily.    . B Complex-C (B-COMPLEX WITH VITAMIN C) tablet Take 1 tablet by mouth daily.  . Blood Glucose Monitoring Suppl (ACCU-CHEK GUIDE) w/Device KIT 1 Piece by Does not apply route as directed.  . colchicine 0.6 MG tablet Take 0.6 mg by mouth daily.  Marland Kitchen glucose blood (ACCU-CHEK GUIDE) test strip Use as instructed  . Insulin Glargine (LANTUS SOLOSTAR) 100 UNIT/ML Solostar Pen Inject 36 Units into the skin at bedtime.  Marland Kitchen labetalol (NORMODYNE) 200 MG tablet Take 1 tablet (200 mg total) by mouth 2 (two) times daily. (Patient taking differently: Take 200 mg by mouth daily. )  . metFORMIN (GLUCOPHAGE-XR) 500 MG 24 hr tablet TAKE 1 TABLET BY MOUTH ONCE DAILY WITH BREAKFAST.  . Multiple Vitamins-Minerals (MULTIVITAMIN ADULT PO) Take 3 tablets by mouth daily.   . ondansetron (ZOFRAN ODT) 4 MG disintegrating tablet 16m  ODT q4 hours prn nausea/vomit  . oxyCODONE-acetaminophen (PERCOCET/ROXICET) 5-325 MG tablet Take 1 tablet by mouth every 6 (six) hours as needed.  . tamsulosin (FLOMAX) 0.4 MG CAPS capsule Take 1 capsule (0.4 mg total) by mouth daily.  . valsartan (DIOVAN) 160 MG tablet Take 160 mg by mouth every evening.   . [DISCONTINUED] rosuvastatin (CRESTOR) 10 MG tablet Take 10 mg by mouth daily.    No facility-administered encounter medications on file as of 07/21/2019.   ALLERGIES: Allergies  Allergen Reactions  . Aspirin   . Claritin [Loratadine]   . Codeine Nausea And Vomiting  . Ketorolac Tromethamine Other (See Comments)    Burns my stomach  . Morphine And Related Other (See Comments)    Tachycardia   VACCINATION STATUS:  There is no immunization history on file for this patient.  HPI  She was diagnosed with type 2  diabetes at approximately age of 17 years.  She is being seen in follow-up for type 2 diabetes, hyperlipidemia, hypertension. She did not bring her logs nor meter to review.  She verbally reports that her previsit A1c was 7% recently.  She denies any hypoglycemia.  -She was diagnosed with type 2 diabetes at approximate age of 25 years. She underwent bariatric surgery on 01/18/2015, which helped her lose approximately 80 pounds of weight over time.  Currently gaining, gained approximately 8 pounds since last visit.   She is also being treated for hypertension, hyperlipidemia and has been overweight to obese most of their adult life. Patient  she has history of stage 1-2 CK D which has since reversed to normal, denies history of CAD, CVA, Neuropathy, and Retinopathy. Patient has  a family history of type 2 DM in her sister, parents, and grandparents. Patient is a non- smoker, does not  participate in a regular exercise program. Patient is willing to engage in intensive monitoring and therapy along with change in life style.   Review of systems:  Constitutional: + weight gain, no fatigue, no subjective hyperthermia, no subjective hypothermia Eyes: no blurry vision, no xerophthalmia ENT: no sore throat, no nodules palpated in throat, no dysphagia/odynophagia, no hoarseness Cardiovascular: no Chest Pain, no Shortness of Breath, no palpitations, no leg swelling Respiratory: no cough, no SOB Gastrointestinal: no Nausea/Vomiting/Diarhhea Musculoskeletal: no muscle/joint aches Skin: no rashes Neurological: no tremors, no numbness, no tingling, no dizziness Psychiatric: no depression, no anxiety    Objective:    BP (!) 153/76   Pulse 71   Ht 5' 5" (1.651 m)   Wt 182 lb 6.4 oz (82.7 kg)   BMI 30.35 kg/m   Wt Readings from Last 3 Encounters:  07/21/19 182 lb 6.4 oz (82.7 kg)  01/23/18 174 lb (78.9 kg)  12/18/16 178 lb (80.7 kg)     Physical Exam- Limited  Constitutional:  Body mass  index is 30.35 kg/m. , not in acute distress, normal state of mind Eyes:  EOMI, no exophthalmos Neck: Supple Respiratory: Adequate breathing efforts Musculoskeletal: no gross deformities, strength intact in all four extremities, no gross restriction of joint movements Skin:  no rashes, no hyperemia Neurological: no tremor with outstretched hands.   Results for orders placed or performed in visit on 01/29/19  Comprehensive metabolic panel  Result Value Ref Range   Glucose, Bld 86 65 - 139 mg/dL   BUN 16 7 - 25 mg/dL   Creat 0.86 0.50 - 0.99 mg/dL   BUN/Creatinine Ratio NOT APPLICABLE 6 - 22 (calc)   Sodium 139 135 -  146 mmol/L   Potassium 4.1 3.5 - 5.3 mmol/L   Chloride 104 98 - 110 mmol/L   CO2 27 20 - 32 mmol/L   Calcium 9.3 8.6 - 10.4 mg/dL   Total Protein 6.9 6.1 - 8.1 g/dL   Albumin 4.4 3.6 - 5.1 g/dL   Globulin 2.5 1.9 - 3.7 g/dL (calc)   AG Ratio 1.8 1.0 - 2.5 (calc)   Total Bilirubin 0.5 0.2 - 1.2 mg/dL   Alkaline phosphatase (APISO) 92 37 - 153 U/L   AST 22 10 - 35 U/L   ALT 26 6 - 29 U/L  Hemoglobin A1c  Result Value Ref Range   Hgb A1c MFr Bld 7.2 (H) <5.7 % of total Hgb   Mean Plasma Glucose 160 (calc)   eAG (mmol/L) 8.9 (calc)   Complete Blood Count (Most recent): Lab Results  Component Value Date   WBC 8.3 12/18/2016   HGB 13.3 12/18/2016   HCT 40.3 12/18/2016   MCV 88.2 12/18/2016   PLT 274 12/18/2016   Chemistry (most recent): Lab Results  Component Value Date   NA 139 03/05/2019   K 4.1 03/05/2019   CL 104 03/05/2019   CO2 27 03/05/2019   BUN 16 03/05/2019   CREATININE 0.86 03/05/2019   Diabetic Labs (most recent): Lab Results  Component Value Date   HGBA1C 7.2 (H) 03/05/2019   HGBA1C 6.6 (H) 11/13/2017   HGBA1C 7.6 (A) 04/15/2015      Assessment & Plan:   1. Type 2 diabetes mellitus with stage 1 chronic kidney disease, without long-term current use of insulin ([place]  -This patient achieved approximate 80 pounds of weight loss after  bariatric surgery in 2016.   Her reported previsit A1c was 7%.  -Patient remains at a high risk for more acute and chronic complications of diabetes which include CAD, CVA, CKD, retinopathy, and neuropathy. These are all discussed in detail with the patient.  - I have counseled the patient on diet management and weight loss, by adopting a carbohydrate restricted/protein rich diet.  - Patient is advised to stick to a routine mealtimes to eat 3 meals  a day and necessary snacks to avoid hypoglycemia.  - she  admits there is a room for improvement in her diet and drink choices. -  Suggestion is made for her to avoid simple carbohydrates  from her diet including Cakes, Sweet Desserts / Pastries, Ice Cream, Soda (diet and regular), Sweet Tea, Candies, Chips, Cookies, Sweet Pastries,  Store Bought Juices, Alcohol in Excess of  1-2 drinks a day, Artificial Sweeteners, Coffee Creamer, and "Sugar-free" Products. This will help patient to have stable blood glucose profile and potentially avoid unintended weight gain.  - I encouraged the patient to switch to  unprocessed or minimally processed complex starch and increased protein intake (animal or plant source), fruits, and vegetables.   - I have approached patient with the following individualized plan to manage diabetes and patient agrees:    -She is advised to increase her Lantus to 36 units nightly, associated with monitoring of blood glucose twice a day-daily before breakfast and at bedtime. -She has tolerated low-dose Metformin.  She is advised to continue Metformin 500 mg XR p.o. daily after breakfast.     - Patient will be considered for incretin therapy as appropriate next visit. - Patient specific target  A1c;  LDL, HDL, Triglycerides, and  Waist Circumference were discussed in detail.  2) BP/HTN: Her blood pressure is not controlled to target.  She will continue on amlodipine 10 mg p.o. daily, labetalol 200 mg p.o. twice daily, and  valsartan 160 mg p.o. daily.   3) Lipids/HPL: No recent lipid panel to review.  She is advised to continue Crestor 10 mg p.o. nightly.     4)  Obesity: She is status post bariatric surgery, lost 80 pounds so far.  Gained 8 pounds since last visit.  I encouraged her to stay engaged in the dietary recommendations, exercise, and carbohydrates information provided.  5) Chronic Care/Health Maintenance:  -Patient ARB and Statin medications and encouraged to continue to follow up with Ophthalmology, Podiatrist at least yearly or according to recommendations, and advised to  stay away from smoking. I have recommended yearly flu vaccine and pneumonia vaccination at least every 5 years; moderate intensity exercise for up to 150 minutes weekly; and  sleep for at least 7 hours a day.   I advised patient to maintain close follow up with their PCP for primary care needs.   - Time spent on this patient care encounter:  35 min, of which >50% was spent in  counseling and the rest reviewing her  current and  previous labs/studies ( including abstraction from other facilities),  previous treatments, her blood glucose readings, and medications' doses and developing a plan for long-term care based on the latest recommendations for standards of care; and documenting her care.  Allison Lara participated in the discussions, expressed understanding, and voiced agreement with the above plans.  All questions were answered to her satisfaction. she is encouraged to contact clinic should she have any questions or concerns prior to her return visit.   Follow up plan: Return in about 4 months (around 11/18/2019) for Follow up with Pre-visit Labs, Bring Meter and Logs- A1c in Office.  Glade Lloyd, MD Phone: [phone]  Fax: [phone]  -  This note was partially dictated with voice recognition software. Similar sounding words can be transcribed inadequately or may not  be corrected upon review.  07/21/2019, 7:54  PM

## 2019-07-21 NOTE — Patient Instructions (Signed)
                                     Advice for Weight Management  -For most of us the best way to lose weight is by diet management. Generally speaking, diet management means consuming less calories intentionally which over time brings about progressive weight loss.  This can be achieved more effectively by restricting carbohydrate consumption to the minimum possible.  So, it is critically important to know your numbers: how much calorie you are consuming and how much calorie you need. More importantly, our carbohydrates sources should be unprocessed or minimally processed complex starch food items.   Sometimes, it is important to balance nutrition by increasing protein intake (animal or plant source), fruits, and vegetables.  -Sticking to a routine mealtime to eat 3 meals a day and avoiding unnecessary snacks is shown to have a big role in weight control. Under normal circumstances, the only time we lose real weight is when we are hungry, so allow hunger to take place- hunger means no food between meal times, only water.  It is not advisable to starve.   -It is better to avoid simple carbohydrates including: Cakes, Sweet Desserts, Ice Cream, Soda (diet and regular), Sweet Tea, Candies, Chips, Cookies, Store Bought Juices, Alcohol in Excess of  1-2 drinks a day, Artificial Sweeteners, Doughnuts, Coffee Creamers, "Sugar-free" Products, etc, etc.  This is not a complete list.....    -Consulting with certified diabetes educators is proven to provide you with the most accurate and current information on diet.  Also, you may be  interested in discussing diet options/exchanges , we can schedule a visit with Penny Crumpton, RDN, CDE for individualized nutrition education.  -Exercise: If you are able: 30 -60 minutes a day ,4 days a week, or 150 minutes a week.  The longer the better.  Combine stretch, strength, and aerobic activities.  If you were told in the past that you  have high risk for cardiovascular diseases, you may seek evaluation by your heart doctor prior to initiating moderate to intense exercise programs.                                  Additional Care Considerations for Diabetes   -Diabetes  is a chronic disease.  The most important care consideration is regular follow-up with your diabetes care provider with the goal being avoiding or delaying its complications and to take advantage of advances in medications and technology.    -Type 2 diabetes is known to coexist with other important comorbidities such as high blood pressure and high cholesterol.  It is critical to control not only the diabetes but also the high blood pressure and high cholesterol to minimize and delay the risk of complications including coronary artery disease, stroke, amputations, blindness, etc.    - Studies showed that people with diabetes will benefit from a class of medications known as ACE inhibitors and statins.  Unless there are specific reasons not to be on these medications, the standard of care is to consider getting one from these groups of medications at an optimal doses.  These medications are generally considered safe and proven to help protect the heart and the kidneys.    - People with diabetes are encouraged to initiate and maintain regular follow-up with eye doctors, foot doctors, dentists ,   and if necessary heart and kidney doctors.     - It is highly recommended that people with diabetes quit smoking or stay away from smoking, and get yearly  flu vaccine and pneumonia vaccine at least every 5 years.  One other important lifestyle recommendation is to ensure adequate sleep - at least 6-7 hours of uninterrupted sleep at night.  -Exercise: If you are able: 30 -60 minutes a day, 4 days a week, or 150 minutes a week.  The longer the better.  Combine stretch, strength, and aerobic activities.  If you were told in the past that you have high risk for cardiovascular  diseases, you may seek evaluation by your heart doctor prior to initiating moderate to intense exercise programs.     COVID-19 Vaccine Information can be found at: [email or web address] For questions related to vaccine distribution or appointments, please email [email or web address] or call [phone].        

## 2019-07-24 ENCOUNTER — Telehealth: Payer: Self-pay | Admitting: "Endocrinology

## 2019-10-17 ENCOUNTER — Other Ambulatory Visit: Payer: Self-pay | Admitting: "Endocrinology

## 2019-11-19 ENCOUNTER — Ambulatory Visit: Payer: Medicare Other | Admitting: "Endocrinology

## 2020-04-07 ENCOUNTER — Other Ambulatory Visit: Payer: Self-pay | Admitting: "Endocrinology

## 2020-08-17 ENCOUNTER — Other Ambulatory Visit: Payer: Self-pay

## 2020-08-17 DIAGNOSIS — E1165 Type 2 diabetes mellitus with hyperglycemia: Secondary | ICD-10-CM

## 2020-08-17 DIAGNOSIS — I10 Essential (primary) hypertension: Secondary | ICD-10-CM

## 2020-08-17 DIAGNOSIS — E782 Mixed hyperlipidemia: Secondary | ICD-10-CM

## 2020-08-19 LAB — COMPREHENSIVE METABOLIC PANEL
ALT: 19 IU/L (ref 0–32)
AST: 21 IU/L (ref 0–40)
Albumin/Globulin Ratio: 1.4 (ref 1.2–2.2)
Albumin: 4.2 g/dL (ref 3.8–4.8)
Alkaline Phosphatase: 114 IU/L (ref 44–121)
BUN/Creatinine Ratio: 14 (ref 12–28)
BUN: 13 mg/dL (ref 8–27)
Bilirubin Total: 0.3 mg/dL (ref 0.0–1.2)
CO2: 22 mmol/L (ref 20–29)
Calcium: 9.1 mg/dL (ref 8.7–10.3)
Chloride: 102 mmol/L (ref 96–106)
Creatinine, Ser: 0.92 mg/dL (ref 0.57–1.00)
GFR calc Af Amer: 75 mL/min/{1.73_m2} (ref >59)
GFR calc non Af Amer: 65 mL/min/{1.73_m2} (ref >59)
Globulin, Total: 2.9 g/dL (ref 1.5–4.5)
Glucose: 146 mg/dL — ABNORMAL HIGH (ref 65–99)
Potassium: 4.5 mmol/L (ref 3.5–5.2)
Sodium: 140 mmol/L (ref 134–144)
Total Protein: 7.1 g/dL (ref 6.0–8.5)

## 2020-08-19 LAB — LIPID PANEL
Chol/HDL Ratio: 4.3 ratio (ref 0.0–4.4)
Cholesterol, Total: 215 mg/dL — ABNORMAL HIGH (ref 100–199)
HDL: 50 mg/dL (ref >39)
LDL Chol Calc (NIH): 141 mg/dL — ABNORMAL HIGH (ref 0–99)
Triglycerides: 133 mg/dL (ref 0–149)
VLDL Cholesterol Cal: 24 mg/dL (ref 5–40)

## 2020-08-19 LAB — T4, FREE: Free T4: 1.14 ng/dL (ref 0.82–1.77)

## 2020-08-19 LAB — TSH: TSH: 2.55 u[IU]/mL (ref 0.450–4.500)

## 2020-08-19 LAB — MICROALBUMIN / CREATININE URINE RATIO
Creatinine, Urine: 188.6 mg/dL
Microalb/Creat Ratio: 73 mg/g creat — ABNORMAL HIGH (ref 0–29)
Microalbumin, Urine: 138.3 ug/mL

## 2020-08-20 ENCOUNTER — Other Ambulatory Visit: Payer: Self-pay

## 2020-08-20 ENCOUNTER — Encounter: Payer: Self-pay | Admitting: "Endocrinology

## 2020-08-20 ENCOUNTER — Ambulatory Visit (INDEPENDENT_AMBULATORY_CARE_PROVIDER_SITE_OTHER): Payer: Medicare Other | Admitting: "Endocrinology

## 2020-08-20 VITALS — BP 146/74 | HR 60 | Ht 65.0 in | Wt 181.2 lb

## 2020-08-20 DIAGNOSIS — E782 Mixed hyperlipidemia: Secondary | ICD-10-CM

## 2020-08-20 DIAGNOSIS — E1165 Type 2 diabetes mellitus with hyperglycemia: Secondary | ICD-10-CM | POA: Diagnosis not present

## 2020-08-20 DIAGNOSIS — I10 Essential (primary) hypertension: Secondary | ICD-10-CM | POA: Diagnosis not present

## 2020-08-20 LAB — POCT GLYCOSYLATED HEMOGLOBIN (HGB A1C): HbA1c, POC (controlled diabetic range): 8.2 % — AB (ref 0.0–7.0)

## 2020-08-20 MED ORDER — LANTUS SOLOSTAR 100 UNIT/ML ~~LOC~~ SOPN: 40.0000 [IU] | PEN_INJECTOR | Freq: Every day | SUBCUTANEOUS | 2 refills | Status: DC

## 2020-08-20 MED ORDER — ROSUVASTATIN CALCIUM 5 MG PO TABS: 5.0000 mg | ORAL_TABLET | Freq: Every day | ORAL | 3 refills | Status: DC

## 2020-08-20 NOTE — Patient Instructions (Signed)
                                     Advice for Weight Management  -For most of us the best way to lose weight is by diet management. Generally speaking, diet management means consuming less calories intentionally which over time brings about progressive weight loss.  This can be achieved more effectively by restricting carbohydrate consumption to the minimum possible.  So, it is critically important to know your numbers: how much calorie you are consuming and how much calorie you need. More importantly, our carbohydrates sources should be unprocessed or minimally processed complex starch food items.   Sometimes, it is important to balance nutrition by increasing protein intake (animal or plant source), fruits, and vegetables.  -Sticking to a routine mealtime to eat 3 meals a day and avoiding unnecessary snacks is shown to have a big role in weight control. Under normal circumstances, the only time we lose real weight is when we are hungry, so allow hunger to take place- hunger means no food between meal times, only water.  It is not advisable to starve.   -It is better to avoid simple carbohydrates including: Cakes, Sweet Desserts, Ice Cream, Soda (diet and regular), Sweet Tea, Candies, Chips, Cookies, Store Bought Juices, Alcohol in Excess of  1-2 drinks a day, Lemonade,  Artificial Sweeteners, Doughnuts, Coffee Creamers, "Sugar-free" Products, etc, etc.  This is not a complete list.....    -Consulting with certified diabetes educators is proven to provide you with the most accurate and current information on diet.  Also, you may be  interested in discussing diet options/exchanges , we can schedule a visit with Allison Lara, RDN, CDE for individualized nutrition education.  -Exercise: If you are able: 30 -60 minutes a day ,4 days a week, or 150 minutes a week.  The longer the better.  Combine stretch, strength, and aerobic activities.  If you were told in the  past that you have high risk for cardiovascular diseases, you may seek evaluation by your heart doctor prior to initiating moderate to intense exercise programs.                                  Additional Care Considerations for Diabetes   -Diabetes  is a chronic disease.  The most important care consideration is regular follow-up with your diabetes care provider with the goal being avoiding or delaying its complications and to take advantage of advances in medications and technology.    -Type 2 diabetes is known to coexist with other important comorbidities such as high blood pressure and high cholesterol.  It is critical to control not only the diabetes but also the high blood pressure and high cholesterol to minimize and delay the risk of complications including coronary artery disease, stroke, amputations, blindness, etc.    - Studies showed that people with diabetes will benefit from a class of medications known as ACE inhibitors and statins.  Unless there are specific reasons not to be on these medications, the standard of care is to consider getting one from these groups of medications at an optimal doses.  These medications are generally considered safe and proven to help protect the heart and the kidneys.    - People with diabetes are encouraged to initiate and maintain regular follow-up with eye doctors, foot   doctors, dentists , and if necessary heart and kidney doctors.     - It is highly recommended that people with diabetes quit smoking or stay away from smoking, and get yearly  flu vaccine and pneumonia vaccine at least every 5 years.  One other important lifestyle recommendation is to ensure adequate sleep - at least 6-7 hours of uninterrupted sleep at night.  -Exercise: If you are able: 30 -60 minutes a day, 4 days a week, or 150 minutes a week.  The longer the better.  Combine stretch, strength, and aerobic activities.  If you were told in the past that you have high risk for  cardiovascular diseases, you may seek evaluation by your heart doctor prior to initiating moderate to intense exercise programs.          

## 2020-08-20 NOTE — Progress Notes (Signed)
08/20/2020  Endocrinology follow-up note   Subjective:    Patient ID: Allison Lara, female    DOB: 1950-07-09,    Past Medical History:  Diagnosis Date  . Diabetes mellitus   . Hiatal hernia   . Hypertension   . Renal disorder    kidney stones  . Sleep apnea    Past Surgical History:  Procedure Laterality Date  . ABDOMINAL HYSTERECTOMY    . APPENDECTOMY    . BREAST SURGERY    . CHOLECYSTECTOMY    . GASTRIC BYPASS    . HERNIA REPAIR    . TONSILLECTOMY    . tubal ligation     Social History   Socioeconomic History  . Marital status: Legally Separated    Spouse name: Not on file  . Number of children: Not on file  . Years of education: Not on file  . Highest education level: Not on file  Occupational History  . Not on file  Tobacco Use  . Smoking status: Never Smoker  . Smokeless tobacco: Never Used  Vaping Use  . Vaping Use: Never used  Substance and Sexual Activity  . Alcohol use: No  . Drug use: No  . Sexual activity: Yes    Birth control/protection: Post-menopausal  Other Topics Concern  . Not on file  Social History Narrative  . Not on file   Social Determinants of Health   Financial Resource Strain: Not on file  Food Insecurity: Not on file  Transportation Needs: Not on file  Physical Activity: Not on file  Stress: Not on file  Social Connections: Not on file   Outpatient Encounter Medications as of 08/20/2020  Medication Sig  . rosuvastatin (CRESTOR) 5 MG tablet Take 1 tablet (5 mg total) by mouth daily.  Marland Kitchen acetaminophen (TYLENOL) 500 MG tablet Take 500 mg by mouth every 6 (six) hours as needed for mild pain.   Marland Kitchen amLODipine (NORVASC) 10 MG tablet Take 10 mg by mouth daily.    . B Complex-C (B-COMPLEX WITH VITAMIN C) tablet Take 1 tablet by mouth daily.  . Blood Glucose Monitoring Suppl (ACCU-CHEK GUIDE) w/Device KIT 1 Piece by Does not apply route as directed.  . colchicine 0.6 MG tablet Take 0.6 mg by mouth daily.  Marland Kitchen glucose blood  (ACCU-CHEK GUIDE) test strip Use as instructed  . insulin glargine (LANTUS SOLOSTAR) 100 UNIT/ML Solostar Pen Inject 40 Units into the skin at bedtime.  Marland Kitchen labetalol (NORMODYNE) 200 MG tablet Take 1 tablet (200 mg total) by mouth 2 (two) times daily. (Patient taking differently: Take 200 mg by mouth daily. )  . metFORMIN (GLUCOPHAGE-XR) 500 MG 24 hr tablet TAKE 1 TABLET BY MOUTH ONCE DAILY WITH BREAKFAST.  . Multiple Vitamins-Minerals (MULTIVITAMIN ADULT PO) Take 3 tablets by mouth daily.   . ondansetron (ZOFRAN ODT) 4 MG disintegrating tablet 22m ODT q4 hours prn nausea/vomit  . oxyCODONE-acetaminophen (PERCOCET/ROXICET) 5-325 MG tablet Take 1 tablet by mouth every 6 (six) hours as needed.  . tamsulosin (FLOMAX) 0.4 MG CAPS capsule Take 1 capsule (0.4 mg total) by mouth daily.  . valsartan (DIOVAN) 160 MG tablet Take 160 mg by mouth every evening.   . [DISCONTINUED] Insulin Glargine (LANTUS SOLOSTAR) 100 UNIT/ML Solostar Pen Inject 36 Units into the skin at bedtime.   No facility-administered encounter medications on file as of 08/20/2020.   ALLERGIES: Allergies  Allergen Reactions  . Aspirin   . Claritin [Loratadine]   . Codeine Nausea And Vomiting  . Ketorolac Tromethamine  Other (See Comments)    Burns my stomach  . Morphine And Related Other (See Comments)    Tachycardia   VACCINATION STATUS:  There is no immunization history on file for this patient.  HPI  She was diagnosed with type 2 diabetes at approximately age of 46 years.  She is returning for follow-up of her currently uncontrolled type 2 diabetes, hyperlipidemia, hypertension.    She did not bring any meter nor logs to review.  Admittedly, she is not monitoring regularly, and did not stay on the prescribed amount of insulin and took herself off of Metformin for no clear reasons.   Her point-of-care A1c is 8.2% increasing from 7.2% during her last visit.    She denies any hypoglycemia.  She underwent bariatric surgery  on 01/18/2015, which helped her lose approximately 80 pounds of weight over time.  She has maintained a steady weight since last visit.   She is also being treated for hypertension, hyperlipidemia and has been overweight to obese most of their adult life.  Once again, she took herself off of Crestor for unclear reasons.  She has baseline arthralgias and myalgias, is not particularly sure if statins made it worse. Patient  she has history of stage 1-2 CK D which has since reversed to normal, denies history of CAD, CVA, Neuropathy, and Retinopathy. Patient has  a family history of type 2 DM in her sister, parents, and grandparents. Patient is a non- smoker, does not  participate in a regular exercise program. Patient is willing to engage in intensive monitoring and therapy along with change in life style.   Review of systems:  Limited as above.  Objective:    BP (!) 146/74   Pulse 60   Ht 5' 5" (1.651 m)   Wt 181 lb 3.2 oz (82.2 kg)   BMI 30.15 kg/m   Wt Readings from Last 3 Encounters:  08/20/20 181 lb 3.2 oz (82.2 kg)  07/21/19 182 lb 6.4 oz (82.7 kg)  01/23/18 174 lb (78.9 kg)     Physical Exam- Limited  Constitutional:  Body mass index is 30.15 kg/m. , not in acute distress, normal state of mind Reluctant affect.  Results for orders placed or performed in visit on 08/20/20  HgB A1c  Result Value Ref Range   Hemoglobin A1C     HbA1c POC (<> result, manual entry)     HbA1c, POC (prediabetic range)     HbA1c, POC (controlled diabetic range) 8.2 (A) 0.0 - 7.0 %   Complete Blood Count (Most recent): Lab Results  Component Value Date   WBC 8.3 12/18/2016   HGB 13.3 12/18/2016   HCT 40.3 12/18/2016   MCV 88.2 12/18/2016   PLT 274 12/18/2016   Chemistry (most recent): Lab Results  Component Value Date   NA 140 08/18/2020   K 4.5 08/18/2020   CL 102 08/18/2020   CO2 22 08/18/2020   BUN 13 08/18/2020   CREATININE 0.92 08/18/2020   Diabetic Labs (most recent): Lab  Results  Component Value Date   HGBA1C 8.2 (A) 08/20/2020   HGBA1C 7.2 (H) 03/05/2019   HGBA1C 6.6 (H) 11/13/2017      Assessment & Plan:   1. Type 2 diabetes mellitus with stage 1 chronic kidney disease, without long-term current use of insulin (HCC  -This patient achieved approximate 80 pounds of weight loss after bariatric surgery in 2016.   Her point-of-care A1c is 8.2%, increasing from 7.2%.  -Patient remains at a high   risk for more acute and chronic complications of diabetes which include CAD, CVA, CKD, retinopathy, and neuropathy. These are all discussed in detail with the patient.  - I have counseled the patient on diet management and weight loss, by adopting a carbohydrate restricted/protein rich diet.  - Patient is advised to stick to a routine mealtimes to eat 3 meals  a day and necessary snacks to avoid hypoglycemia. - she acknowledges that there is a room for improvement in her food and drink choices. - Suggestion is made for her to avoid simple carbohydrates  from her diet including Cakes, Sweet Desserts, Ice Cream, Soda (diet and regular), Sweet Tea, Candies, Chips, Cookies, Store Bought Juices, Alcohol in Excess of  1-2 drinks a day, Artificial Sweeteners,  Coffee Creamer, and "Sugar-free" Products, Lemonade. This will help patient to have more stable blood glucose profile and potentially avoid unintended weight gain.  - I have approached patient with the following individualized plan to manage diabetes and patient agrees:    -She is approached to stay on prescribed treatment in order for her to achieve control of diabetes to target with  -She is advised to increase her Lantus to 40 units nightly, associated with monitoring of blood glucose twice a day-daily before breakfast and at bedtime.   -She is encouraged to call clinic if she is developing hypoglycemia below 70 or hyperglycemia above 200 at fasting.   -She has tolerated low-dose Metformin, will benefit from  reinitiation.  I discussed and reinitiated her Metformin 500 mg XR p.o. daily after breakfast.   - Patient will be considered for incretin therapy as appropriate next visit. - Patient specific target  A1c;  LDL, HDL, Triglycerides,  were discussed in detail.  2) BP/HTN: Her blood pressure is not controlled to target.  She will continue on amlodipine 10 mg p.o. daily, labetalol 200 mg p.o. twice daily, and valsartan 160 mg p.o. daily.   3) Lipids/HPL: Her recent lipid panel showed uncontrolled LDL at 141.  She is willing to retry Crestor 5 mg.  She is advised to start taking 5 mg every other day until her body gets used to it and will be advanced as tolerated.     4)  Obesity: She is status post bariatric surgery, lost 80 pounds so far.  She has steady weight since last visit.  I encouraged her to stay engaged in the dietary recommendations, exercise, and carbohydrates information provided.  5) Chronic Care/Health Maintenance:  -Patient ARB and Statin medications and encouraged to continue to follow up with Ophthalmology, Podiatrist at least yearly or according to recommendations, and advised to  stay away from smoking. I have recommended yearly flu vaccine and pneumonia vaccination at least every 5 years; moderate intensity exercise for up to 150 minutes weekly; and  sleep for at least 7 hours a day.   I advised patient to maintain close follow up with their PCP for primary care needs.  - Time spent on this patient care encounter:  40 min, of which > 50% was spent in  counseling and the rest reviewing her blood glucose logs , discussing her hypoglycemia and hyperglycemia episodes, reviewing her current and  previous labs / studies  ( including abstraction from other facilities) and medications  doses and developing a  long term treatment plan and documenting her care.   Please refer to Patient Instructions for Blood Glucose Monitoring and Insulin/Medications Dosing Guide"  in media tab for  additional information. Please  also refer to "   Patient Self Inventory" in the Media  tab for reviewed elements of pertinent patient history.  Akeia T Sayas participated in the discussions, expressed understanding, and voiced agreement with the above plans.  All questions were answered to her satisfaction. she is encouraged to contact clinic should she have any questions or concerns prior to her return visit.   Follow up plan: Return in about 3 months (around 11/17/2020) for Bring Meter and Logs- A1c in Office.  Gebre Nida, MD Phone: [phone]  Fax: [phone]  -  This note was partially dictated with voice recognition software. Similar sounding words can be transcribed inadequately or may not  be corrected upon review.  08/20/2020, 12:40 PM  

## 2020-08-23 ENCOUNTER — Ambulatory Visit: Payer: Medicare Other | Admitting: "Endocrinology

## 2020-08-25 ENCOUNTER — Ambulatory Visit: Payer: Medicare Other | Admitting: "Endocrinology

## 2020-11-10 NOTE — Telephone Encounter (Signed)
error 

## 2020-11-18 ENCOUNTER — Ambulatory Visit: Payer: Medicare Other | Admitting: "Endocrinology

## 2020-11-25 ENCOUNTER — Ambulatory Visit: Payer: Medicare Other | Admitting: "Endocrinology

## 2020-12-03 ENCOUNTER — Other Ambulatory Visit: Payer: Self-pay | Admitting: "Endocrinology

## 2020-12-06 ENCOUNTER — Other Ambulatory Visit: Payer: Self-pay | Admitting: "Endocrinology

## 2020-12-07 ENCOUNTER — Ambulatory Visit: Payer: Medicare Other | Admitting: "Endocrinology

## 2021-01-20 ENCOUNTER — Telehealth: Payer: Self-pay

## 2021-01-20 DIAGNOSIS — E1165 Type 2 diabetes mellitus with hyperglycemia: Secondary | ICD-10-CM

## 2021-01-20 NOTE — Telephone Encounter (Signed)
Patient is asking for an appt, last OV was February, Does she need updated labs?

## 2021-01-20 NOTE — Telephone Encounter (Signed)
Left a VM for a return call

## 2021-01-21 NOTE — Telephone Encounter (Signed)
Pt returned call, she will completed the lab and call back for an appt

## 2021-01-21 NOTE — Telephone Encounter (Signed)
Called pt, no answer.

## 2021-02-02 ENCOUNTER — Other Ambulatory Visit: Payer: Self-pay | Admitting: "Endocrinology

## 2021-03-08 ENCOUNTER — Other Ambulatory Visit: Payer: Self-pay | Admitting: "Endocrinology

## 2021-04-07 ENCOUNTER — Other Ambulatory Visit: Payer: Self-pay | Admitting: "Endocrinology

## 2021-05-04 ENCOUNTER — Other Ambulatory Visit: Payer: Self-pay | Admitting: "Endocrinology

## 2021-06-03 ENCOUNTER — Other Ambulatory Visit: Payer: Self-pay | Admitting: "Endocrinology

## 2021-06-29 ENCOUNTER — Other Ambulatory Visit: Payer: Self-pay | Admitting: "Endocrinology

## 2021-07-04 ENCOUNTER — Other Ambulatory Visit: Payer: Self-pay | Admitting: "Endocrinology

## 2021-08-08 ENCOUNTER — Other Ambulatory Visit: Payer: Self-pay | Admitting: "Endocrinology

## 2021-08-10 ENCOUNTER — Other Ambulatory Visit: Payer: Self-pay | Admitting: "Endocrinology

## 2022-01-11 ENCOUNTER — Other Ambulatory Visit (HOSPITAL_COMMUNITY): Payer: Self-pay | Admitting: Internal Medicine

## 2022-01-11 DIAGNOSIS — Z1231 Encounter for screening mammogram for malignant neoplasm of breast: Secondary | ICD-10-CM

## 2022-01-12 ENCOUNTER — Other Ambulatory Visit (HOSPITAL_COMMUNITY): Payer: Self-pay | Admitting: Internal Medicine

## 2022-01-12 ENCOUNTER — Inpatient Hospital Stay
Admission: RE | Admit: 2022-01-12 | Discharge: 2022-01-12 | Disposition: A | Discharge status: Home / Self Care | Payer: Self-pay | Source: Ambulatory Visit | Attending: Internal Medicine | Admitting: Internal Medicine

## 2022-01-12 DIAGNOSIS — Z1231 Encounter for screening mammogram for malignant neoplasm of breast: Secondary | ICD-10-CM

## 2022-01-16 ENCOUNTER — Encounter (HOSPITAL_COMMUNITY): Payer: Self-pay

## 2022-01-16 ENCOUNTER — Ambulatory Visit (HOSPITAL_COMMUNITY): Payer: Medicare Other

## 2022-01-17 ENCOUNTER — Encounter: Payer: Self-pay | Admitting: Internal Medicine

## 2022-02-15 ENCOUNTER — Ambulatory Visit: Payer: Medicare Other | Admitting: Gastroenterology

## 2022-02-17 ENCOUNTER — Ambulatory Visit: Payer: Medicare Other | Admitting: Obstetrics & Gynecology

## 2022-08-09 ENCOUNTER — Other Ambulatory Visit: Payer: Self-pay | Admitting: "Endocrinology
